# Patient Record
Sex: Male | Born: 1990 | Race: White | Hispanic: No | Marital: Single | Smoking: Never smoker
Health system: Southern US, Community
[De-identification: ages and names within clinical notes are randomized; demographics above are authoritative.]

## PROBLEM LIST (undated history)

## (undated) DIAGNOSIS — E669 Obesity, unspecified: Secondary | ICD-10-CM

## (undated) DIAGNOSIS — F909 Attention-deficit hyperactivity disorder, unspecified type: Secondary | ICD-10-CM

## (undated) DIAGNOSIS — F419 Anxiety disorder, unspecified: Secondary | ICD-10-CM

## (undated) DIAGNOSIS — R278 Other lack of coordination: Secondary | ICD-10-CM

## (undated) DIAGNOSIS — H539 Unspecified visual disturbance: Secondary | ICD-10-CM

## (undated) DIAGNOSIS — R488 Other symbolic dysfunctions: Secondary | ICD-10-CM

## (undated) DIAGNOSIS — I7300 Raynaud's syndrome without gangrene: Secondary | ICD-10-CM

## (undated) DIAGNOSIS — F810 Specific reading disorder: Secondary | ICD-10-CM

## (undated) HISTORY — DX: Other symbolic dysfunctions: R48.8

## (undated) HISTORY — PX: WISDOM TOOTH EXTRACTION: SHX21

## (undated) HISTORY — DX: Raynaud's syndrome without gangrene: I73.00

## (undated) HISTORY — DX: Unspecified visual disturbance: H53.9

## (undated) HISTORY — DX: Anxiety disorder, unspecified: F41.9

## (undated) HISTORY — DX: Attention-deficit hyperactivity disorder, unspecified type: F90.9

## (undated) HISTORY — DX: Obesity, unspecified: E66.9

## (undated) HISTORY — DX: Other lack of coordination: R27.8

## (undated) HISTORY — DX: Specific reading disorder: F81.0

## (undated) MED FILL — DEXTROAMPHETAMINE-AMPHETAMINE 20 MG TABLET: 20 20 mg | ORAL | 30 days supply | Qty: 60 | Fill #0

## (undated) MED FILL — VALACYCLOVIR 1 GRAM TABLET: 1000 1000 MG | ORAL | 1 days supply | Qty: 2 | Fill #0

---
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
## 2010-02-07 ENCOUNTER — Ambulatory Visit: Payer: Self-pay | Admitting: Pediatrics

## 2010-06-14 ENCOUNTER — Ambulatory Visit: Payer: Self-pay | Admitting: Pediatrics

## 2010-07-30 ENCOUNTER — Ambulatory Visit: Payer: Self-pay | Admitting: Family Medicine

## 2010-11-04 ENCOUNTER — Ambulatory Visit
Admission: RE | Admit: 2010-11-04 | Discharge: 2010-11-04 | Payer: Self-pay | Source: Home / Self Care | Attending: Pediatrics | Admitting: Pediatrics

## 2010-11-14 ENCOUNTER — Ambulatory Visit: Payer: Self-pay | Admitting: Family Medicine

## 2010-11-24 ENCOUNTER — Encounter: Payer: Self-pay | Admitting: Orthopedic Surgery

## 2010-12-19 ENCOUNTER — Ambulatory Visit: Payer: Self-pay | Admitting: Family Medicine

## 2010-12-24 ENCOUNTER — Ambulatory Visit: Payer: Self-pay | Admitting: Family Medicine

## 2011-01-07 ENCOUNTER — Ambulatory Visit: Payer: Self-pay

## 2011-01-16 ENCOUNTER — Ambulatory Visit: Payer: Self-pay | Admitting: Family Medicine

## 2011-03-12 ENCOUNTER — Institutional Professional Consult (permissible substitution): Payer: Self-pay | Admitting: Pediatrics

## 2011-03-12 DIAGNOSIS — R279 Unspecified lack of coordination: Secondary | ICD-10-CM

## 2011-03-12 DIAGNOSIS — F909 Attention-deficit hyperactivity disorder, unspecified type: Secondary | ICD-10-CM

## 2011-06-30 ENCOUNTER — Institutional Professional Consult (permissible substitution): Payer: 59 | Admitting: Pediatrics

## 2011-06-30 DIAGNOSIS — R279 Unspecified lack of coordination: Secondary | ICD-10-CM

## 2011-06-30 DIAGNOSIS — F909 Attention-deficit hyperactivity disorder, unspecified type: Secondary | ICD-10-CM

## 2011-08-12 ENCOUNTER — Ambulatory Visit: Payer: Self-pay | Admitting: Family Medicine

## 2011-10-16 ENCOUNTER — Institutional Professional Consult (permissible substitution): Payer: 59 | Admitting: Pediatrics

## 2011-10-16 DIAGNOSIS — F909 Attention-deficit hyperactivity disorder, unspecified type: Secondary | ICD-10-CM

## 2011-10-16 DIAGNOSIS — R279 Unspecified lack of coordination: Secondary | ICD-10-CM

## 2012-01-16 ENCOUNTER — Institutional Professional Consult (permissible substitution): Payer: 59 | Admitting: Pediatrics

## 2012-01-16 DIAGNOSIS — R279 Unspecified lack of coordination: Secondary | ICD-10-CM

## 2012-01-16 DIAGNOSIS — F909 Attention-deficit hyperactivity disorder, unspecified type: Secondary | ICD-10-CM

## 2012-05-12 ENCOUNTER — Institutional Professional Consult (permissible substitution): Payer: 59 | Admitting: Pediatrics

## 2012-05-12 DIAGNOSIS — R6250 Unspecified lack of expected normal physiological development in childhood: Secondary | ICD-10-CM

## 2012-05-12 DIAGNOSIS — F909 Attention-deficit hyperactivity disorder, unspecified type: Secondary | ICD-10-CM

## 2012-09-23 ENCOUNTER — Ambulatory Visit: Payer: Self-pay | Admitting: Family Medicine

## 2012-10-14 ENCOUNTER — Ambulatory Visit: Payer: Self-pay | Admitting: Family Medicine

## 2012-10-29 ENCOUNTER — Institutional Professional Consult (permissible substitution): Payer: 59 | Admitting: Pediatrics

## 2012-10-29 DIAGNOSIS — F909 Attention-deficit hyperactivity disorder, unspecified type: Secondary | ICD-10-CM

## 2012-10-29 DIAGNOSIS — R279 Unspecified lack of coordination: Secondary | ICD-10-CM

## 2012-12-06 ENCOUNTER — Ambulatory Visit: Payer: Self-pay | Admitting: Family Medicine

## 2012-12-20 ENCOUNTER — Ambulatory Visit: Payer: Self-pay | Admitting: Family Medicine

## 2013-01-24 ENCOUNTER — Institutional Professional Consult (permissible substitution): Payer: 59 | Admitting: Pediatrics

## 2013-01-24 DIAGNOSIS — R279 Unspecified lack of coordination: Secondary | ICD-10-CM

## 2013-01-24 DIAGNOSIS — F909 Attention-deficit hyperactivity disorder, unspecified type: Secondary | ICD-10-CM

## 2013-01-31 ENCOUNTER — Ambulatory Visit: Payer: Self-pay | Admitting: Family Medicine

## 2013-02-21 ENCOUNTER — Institutional Professional Consult (permissible substitution): Payer: 59 | Admitting: Pediatrics

## 2013-02-21 DIAGNOSIS — F909 Attention-deficit hyperactivity disorder, unspecified type: Secondary | ICD-10-CM

## 2013-02-21 DIAGNOSIS — R6250 Unspecified lack of expected normal physiological development in childhood: Secondary | ICD-10-CM

## 2013-02-25 ENCOUNTER — Institutional Professional Consult (permissible substitution): Payer: 59 | Admitting: Family

## 2013-02-25 DIAGNOSIS — F909 Attention-deficit hyperactivity disorder, unspecified type: Secondary | ICD-10-CM

## 2013-05-04 ENCOUNTER — Ambulatory Visit: Payer: Self-pay | Admitting: Ophthalmology

## 2013-07-18 ENCOUNTER — Institutional Professional Consult (permissible substitution): Payer: 59 | Admitting: Family

## 2013-07-18 DIAGNOSIS — F909 Attention-deficit hyperactivity disorder, unspecified type: Secondary | ICD-10-CM

## 2013-10-20 ENCOUNTER — Institutional Professional Consult (permissible substitution): Payer: 59 | Admitting: Pediatrics

## 2013-10-20 DIAGNOSIS — F909 Attention-deficit hyperactivity disorder, unspecified type: Secondary | ICD-10-CM

## 2014-01-30 ENCOUNTER — Institutional Professional Consult (permissible substitution): Payer: 59 | Admitting: Pediatrics

## 2014-01-30 DIAGNOSIS — F909 Attention-deficit hyperactivity disorder, unspecified type: Secondary | ICD-10-CM

## 2014-02-23 ENCOUNTER — Ambulatory Visit: Payer: Self-pay | Admitting: Family Medicine

## 2014-05-22 ENCOUNTER — Institutional Professional Consult (permissible substitution): Payer: 59 | Admitting: Pediatrics

## 2014-05-22 DIAGNOSIS — F909 Attention-deficit hyperactivity disorder, unspecified type: Secondary | ICD-10-CM

## 2014-08-22 ENCOUNTER — Institutional Professional Consult (permissible substitution): Payer: 59 | Admitting: Pediatrics

## 2014-09-05 ENCOUNTER — Institutional Professional Consult (permissible substitution): Payer: 59 | Admitting: Pediatrics

## 2014-09-18 ENCOUNTER — Institutional Professional Consult (permissible substitution): Payer: 59 | Admitting: Pediatrics

## 2014-09-18 DIAGNOSIS — F900 Attention-deficit hyperactivity disorder, predominantly inattentive type: Secondary | ICD-10-CM

## 2014-09-27 ENCOUNTER — Ambulatory Visit: Payer: 59 | Admitting: Psychologist

## 2014-09-27 DIAGNOSIS — F900 Attention-deficit hyperactivity disorder, predominantly inattentive type: Secondary | ICD-10-CM

## 2014-10-10 ENCOUNTER — Other Ambulatory Visit: Payer: 59 | Admitting: Psychologist

## 2014-10-11 ENCOUNTER — Other Ambulatory Visit: Payer: Private Health Insurance - Indemnity | Admitting: Psychologist

## 2014-10-11 DIAGNOSIS — F902 Attention-deficit hyperactivity disorder, combined type: Secondary | ICD-10-CM

## 2014-10-17 ENCOUNTER — Other Ambulatory Visit: Payer: Private Health Insurance - Indemnity | Admitting: Psychologist

## 2014-10-17 DIAGNOSIS — F900 Attention-deficit hyperactivity disorder, predominantly inattentive type: Secondary | ICD-10-CM

## 2014-10-17 DIAGNOSIS — F810 Specific reading disorder: Secondary | ICD-10-CM

## 2014-12-06 ENCOUNTER — Institutional Professional Consult (permissible substitution): Payer: Private Health Insurance - Indemnity | Admitting: Pediatrics

## 2014-12-06 DIAGNOSIS — F900 Attention-deficit hyperactivity disorder, predominantly inattentive type: Secondary | ICD-10-CM

## 2015-04-23 ENCOUNTER — Institutional Professional Consult (permissible substitution): Payer: 59 | Admitting: Pediatrics

## 2015-04-23 ENCOUNTER — Institutional Professional Consult (permissible substitution): Payer: Managed Care, Other (non HMO) | Admitting: Pediatrics

## 2015-04-23 DIAGNOSIS — F810 Specific reading disorder: Secondary | ICD-10-CM | POA: Diagnosis not present

## 2015-04-23 DIAGNOSIS — F900 Attention-deficit hyperactivity disorder, predominantly inattentive type: Secondary | ICD-10-CM | POA: Diagnosis not present

## 2015-05-02 ENCOUNTER — Ambulatory Visit: Payer: Managed Care, Other (non HMO) | Admitting: Psychologist

## 2015-05-02 DIAGNOSIS — F900 Attention-deficit hyperactivity disorder, predominantly inattentive type: Secondary | ICD-10-CM | POA: Diagnosis not present

## 2015-05-14 ENCOUNTER — Ambulatory Visit: Payer: Managed Care, Other (non HMO) | Admitting: Psychologist

## 2015-05-14 DIAGNOSIS — F900 Attention-deficit hyperactivity disorder, predominantly inattentive type: Secondary | ICD-10-CM | POA: Diagnosis not present

## 2015-05-17 ENCOUNTER — Other Ambulatory Visit: Payer: 59 | Admitting: Psychologist

## 2015-06-21 ENCOUNTER — Institutional Professional Consult (permissible substitution): Payer: 59 | Admitting: Psychologist

## 2015-07-19 ENCOUNTER — Institutional Professional Consult (permissible substitution): Payer: Managed Care, Other (non HMO) | Admitting: Pediatrics

## 2015-07-19 DIAGNOSIS — F810 Specific reading disorder: Secondary | ICD-10-CM | POA: Diagnosis not present

## 2015-07-19 DIAGNOSIS — F900 Attention-deficit hyperactivity disorder, predominantly inattentive type: Secondary | ICD-10-CM | POA: Diagnosis not present

## 2015-10-03 ENCOUNTER — Institutional Professional Consult (permissible substitution): Payer: Managed Care, Other (non HMO) | Admitting: Pediatrics

## 2015-10-03 DIAGNOSIS — F900 Attention-deficit hyperactivity disorder, predominantly inattentive type: Secondary | ICD-10-CM | POA: Diagnosis not present

## 2015-10-03 DIAGNOSIS — F810 Specific reading disorder: Secondary | ICD-10-CM | POA: Diagnosis not present

## 2015-12-19 ENCOUNTER — Other Ambulatory Visit (HOSPITAL_COMMUNITY): Payer: Self-pay | Admitting: Ophthalmology

## 2015-12-19 DIAGNOSIS — H5021 Vertical strabismus, right eye: Secondary | ICD-10-CM

## 2016-01-03 ENCOUNTER — Other Ambulatory Visit (HOSPITAL_COMMUNITY): Payer: Self-pay | Admitting: Ophthalmology

## 2016-01-03 ENCOUNTER — Ambulatory Visit (HOSPITAL_COMMUNITY)
Admission: RE | Admit: 2016-01-03 | Discharge: 2016-01-03 | Disposition: A | Discharge status: Home / Self Care | Payer: Managed Care, Other (non HMO) | Source: Ambulatory Visit | Attending: Ophthalmology | Admitting: Ophthalmology

## 2016-01-03 DIAGNOSIS — H5021 Vertical strabismus, right eye: Secondary | ICD-10-CM | POA: Diagnosis not present

## 2016-01-03 MED ORDER — GADOBENATE DIMEGLUMINE 529 MG/ML IV SOLN
15.0000 mL | Freq: Once | INTRAVENOUS | Status: AC | PRN
  Administered 2016-01-03: 15 mL via INTRAVENOUS

## 2016-01-21 ENCOUNTER — Telehealth: Payer: Self-pay | Admitting: Pediatrics

## 2016-01-21 MED ORDER — ATOMOXETINE HCL 100 MG PO CAPS: 100.0000 mg | ORAL_CAPSULE | Freq: Every day | ORAL | Status: DC

## 2016-01-21 NOTE — Telephone Encounter (Signed)
Call from [place], has appointment next Monday, 3/27, not enough meds to last Reordered strattera 100 mg daily with [facility] [address]

## 2016-01-28 ENCOUNTER — Ambulatory Visit (INDEPENDENT_AMBULATORY_CARE_PROVIDER_SITE_OTHER): Payer: Managed Care, Other (non HMO) | Admitting: Family

## 2016-01-28 ENCOUNTER — Encounter: Payer: Self-pay | Admitting: Family

## 2016-01-28 ENCOUNTER — Telehealth: Payer: Self-pay | Admitting: Family

## 2016-01-28 VITALS — BP 118/72 | HR 68 | Resp 16 | Ht 70.5 in | Wt 174.0 lb

## 2016-01-28 DIAGNOSIS — F419 Anxiety disorder, unspecified: Secondary | ICD-10-CM | POA: Diagnosis not present

## 2016-01-28 DIAGNOSIS — F902 Attention-deficit hyperactivity disorder, combined type: Secondary | ICD-10-CM | POA: Diagnosis not present

## 2016-01-28 MED ORDER — STRATTERA 80 MG PO CAPS: 80.0000 mg | ORAL_CAPSULE | Freq: Every day | ORAL | Status: DC

## 2016-01-28 NOTE — Progress Notes (Signed)
  [facility]  [facility]MENTAL AND PSYCHOLOGICAL CENTER [facility] [address], [address] [place] [address] Dept: [phone] Dept Fax: [phone] Loc: [phone] Loc Fax: [phone]  Medical Follow-up  Patient ID: Jonathan Morse, male  DOB: July 07, 1991, 25 y.o.  MRN: [id number]  Date of Evaluation: 01/28/16  PCP: Noni SaupeEDDING II,JOHN F., MD  Accompanied by: Self Patient Lives with: house with 2 roommates  HISTORY/CURRENT STATUS:  HPI  Patient here for routine follow up related to ADHD and medication management.  EDUCATION: School: Geophysicist/field se[employer] Year/Grade: Applying to [employer] Homework Time: varies depending on classes. 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 Current Exercise Habits: Home exercise routine, Type of exercise: strength training/weights, Time (Minutes): 60, Frequency (Times/Week): 5, Weekly Exercise (Minutes/Week): 300, Intensity: Moderate Exercise limited by: None identified  MEDICAL HISTORY: Appetite: Good MVI/Other: daily, pre-wrokout Fruits/Vegs:some Calcium: some Iron:some  Sleep: Bedtime: 11-12;00 am Awakens: 7:00 am on work days Sleep Concerns: Initiation/Maintenance/Other: Better, 7-9 hours each night  Individual Medical History/Review of System Changes? Yes Recent eye dr visit regarding double vision with [facility].  Allergies: Review of patient's allergies indicates no known allergies.  Current Medications:  Current outpatient prescriptions:  .  STRATTERA 80 MG capsule, Take 1 capsule (80 mg total) by mouth daily., Disp: 30 capsule, Rfl: 2 Medication Side Effects: None. Patient wanting to decrease his dose, no reported side effects.     Family Medical/Social History Changes?: No  MENTAL HEALTH: Mental Health Issues: Anxiety  PHYSICAL EXAM: Vitals: There were no vitals filed for this visit., Facility age limit for growth percentiles is 20 years.  General Exam: Physical Exam  Constitutional: He is oriented to person, place, and time. He appears well-developed and well-nourished.  HENT:  Head: Normocephalic and atraumatic.  Right Ear: External ear normal.  Left Ear: External ear normal.  Nose: Nose normal.  Mouth/Throat: Oropharynx is clear and moist.  Eyes: Conjunctivae and EOM are normal. Pupils are equal, round, and reactive to light.  Right eye stabismus  Neck: Normal range of motion. Neck supple.  Cardiovascular: Normal rate, regular rhythm, normal heart sounds and intact distal pulses.   Pulmonary/Chest: Effort normal and breath sounds normal.  Abdominal: Soft. Bowel sounds are normal.  Musculoskeletal: Normal range of motion.  Neurological: He is alert and oriented to person, place, and time. He has normal reflexes.  Skin: Skin is warm and dry.  Psychiatric: He has a normal mood and affect. His behavior is normal. Judgment and thought content normal.  Vitals reviewed.   Neurological: oriented to time, place, and person Cranial Nerves: normal  Neuromuscular:  Motor Mass: normal Tone: normal Strength: normal DTRs: 2+ and symmetric Overflow: none Reflexes: no tremors noted Sensory Exam: Vibratory: intact  Fine Touch: intact  Testing/Developmental Screens:  ASRS-9/16     DIAGNOSES:    ICD-9-CM ICD-10-CM   1. ADHD (attention deficit hyperactivity disorder), combined type 314.01 F90.2   2. Anxiety disorder, unspecified 300.00 F41.9     RECOMMENDATIONS: 3 month follow up. Continue with Strattera now at 80 mg daily dose, MiraLax for occasional constipation every few days.   NEXT APPOINTMENT: Return in about 3 months (around 04/29/2016) for Routine follow up.   [name],  NP Counseling Time: 30 Total Contact Time: 40

## 2016-01-28 NOTE — Telephone Encounter (Signed)
[facility] sent [facility] rejected notice for 80 mg Strattera. Needed a prior authorization for the dose change from 100 mg daily dose to 80 mg daily dose. Submitted prior authorization via cover my meds with confirmation received as follows: Approvedtoday  [id number];AOZHYQMCaseId:[id number];Product Name:ST: ADHD Agents (Adderall IR/XR, Aptensio Cruz CondonXR,Concerta,Daytrana,Desoxyn,Dexedrine,Dyanavel XR,Evekeo, Focalin IR/XR, Metadate CD/ER, Methylin, Procentra, Quillivant XR, QuilliChew ER, RitalinIR/LA/SR, Vyvanse, Kapvay, Intuniv, Strattera, etc) PA ESI;Status:Approved;Coverage Start Date:12/29/2015;Coverage End Date:01/27/2019;

## 2016-03-13 ENCOUNTER — Ambulatory Visit (INDEPENDENT_AMBULATORY_CARE_PROVIDER_SITE_OTHER): Payer: Managed Care, Other (non HMO) | Admitting: Pediatrics

## 2016-03-13 ENCOUNTER — Encounter: Payer: Self-pay | Admitting: Pediatrics

## 2016-03-13 VITALS — BP 110/60 | HR 84 | Ht 70.5 in | Wt 175.6 lb

## 2016-03-13 DIAGNOSIS — F902 Attention-deficit hyperactivity disorder, combined type: Secondary | ICD-10-CM

## 2016-03-13 DIAGNOSIS — F419 Anxiety disorder, unspecified: Secondary | ICD-10-CM

## 2016-03-13 DIAGNOSIS — I7300 Raynaud's syndrome without gangrene: Secondary | ICD-10-CM

## 2016-03-13 DIAGNOSIS — R488 Other symbolic dysfunctions: Secondary | ICD-10-CM | POA: Insufficient documentation

## 2016-03-13 DIAGNOSIS — R278 Other lack of coordination: Secondary | ICD-10-CM

## 2016-03-13 DIAGNOSIS — G509 Disorder of trigeminal nerve, unspecified: Secondary | ICD-10-CM

## 2016-03-13 DIAGNOSIS — F810 Specific reading disorder: Secondary | ICD-10-CM | POA: Diagnosis not present

## 2016-03-13 DIAGNOSIS — H4911 Fourth [trochlear] nerve palsy, right eye: Secondary | ICD-10-CM | POA: Insufficient documentation

## 2016-03-13 MED ORDER — STRATTERA 80 MG PO CAPS: 80.0000 mg | ORAL_CAPSULE | Freq: Every day | ORAL | Status: DC

## 2016-03-13 MED ORDER — MODAFINIL 200 MG PO TABS: 200.0000 mg | ORAL_TABLET | Freq: Every day | ORAL | Status: DC

## 2016-03-13 NOTE — Progress Notes (Signed)
[facility] [facility] [place] [facility] [address], [address] [place] [address] Dept: [phone] Dept Fax: [phone] Loc: [phone] Loc Fax: [phone]  Medical Follow-up  Patient ID: Jonathan FriendlyAlec Scott Harkin, male  DOB: 09-14-1991, 25 y.o.  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  MEDICAL HISTORY: Appetite: Good MVI/Other: Multivitamin and sometimes takes a pre-workout supplement before lifting weights. Fruits/Vegs: A lot Calcium: Yogurt Iron: A lot of meat and eggs  Sleep: Bedtime: 11:30 PM to 12:30 AM Awakens: 7- 8 AM Sleep Concerns: Initiation/Maintenance/Other: No problems  Individual Medical History/Review of System Changes? No  Allergies: Review of patient's allergies indicates no known allergies.  Current Medications:  Current outpatient prescriptions:  .  STRATTERA 80 MG capsule, Take 1 capsule (80 mg total) by mouth daily., Disp: 30 capsule, Rfl: 2 .  modafinil (PROVIGIL) 200 MG tablet, Take 1 tablet (200 mg total) by mouth daily. Take 1/2-1 tablet daily as directed., Disp: 30 tablet, Rfl: 2 Medication Side Effects: None  Family Medical/Social History Changes?: Yes patient is accepted into dental school at [employer] and will start in mid August 2017. He is going to [place] in June to spend some time with his girlfriend, who lives  there.  MENTAL HEALTH: Mental Health Issues: Friends, Peer Relations and Has a girlfriend in [place], and he is going there in June to visit her for at least one month. [place] then [place] and start dental school in the middle of August.  PHYSICAL EXAM: Vitals:  Today's Vitals   04/23/15 1515 07/19/15 1514 10/03/15 1513 03/13/16 1516  BP: 102/60 100/56 110/60   Pulse:   84   Height: 5' 10.5" (1.791 m) 5' 10.5" (1.791 m) 5' 10.5" (1.791 m) 5' 10.5" (1.791 m)  Weight: 176 lb (79.833 kg) 172 lb 12.8 oz (78.382 kg) 172 lb 9.6 oz (78.291 kg) 175 lb 9.6 oz (79.652 kg)  , Facility age limit for growth percentiles is 20 years.  General Exam: Physical Exam  Constitutional: He is oriented to person, place, and time. He appears well-developed and well-nourished.  HENT:  Head: Normocephalic and atraumatic.  Right Ear: External ear normal.  Left Ear: External ear normal.  Nose: Nose normal.  Mouth/Throat: Oropharynx is clear and moist.  Eyes: Conjunctivae and EOM are normal. Pupils are equal, round, and reactive to light.  Neck: Normal range of motion. Neck supple.  Cardiovascular: Normal rate, regular rhythm and normal heart sounds.   Pulmonary/Chest: Effort normal and breath sounds normal.  Abdominal: Soft.  Musculoskeletal: Normal range of motion.  Neurological: He is alert and oriented to person, place, and time. He has normal reflexes.  Skin: Skin is warm and dry. There is erythema.  Hands are red, consistent with Raynaud's phenomenon  Psychiatric: He has a normal mood and affect. His behavior is normal. Judgment and thought content normal.   Neurological: oriented to time, place, and person Cranial Nerves: normal Neuromuscular:  Motor Mass: normal Tone: normal Strength: normal DTRs: 2+ and  symmetric Overflow: no Reflexes: no tremors noted, finger to nose without dysmetria bilaterally, gait was normal, tandem gait was normal, can toe walk, can heel walk, can  hop on each foot and no ataxic movements noted Sensory Exam:  Fine Touch: normal  Testing/Developmental Screens: CGI:1    DIAGNOSES:    ICD-9-CM ICD-10-CM   1. Developmental dysgraphia 784.69 R48.8   2. ADHD (attention deficit hyperactivity disorder), combined type 314.01 F90.2   3. Anxiety disorder, unspecified 300.00 F41.9   4. Reading disorder 315.00 F81.0   5. Raynaud's phenomenon 443.0 I73.00     RECOMMENDATIONS:  Patient Instructions  Continue Strattera 80 mg every morning. May consider reducing dose to 60 mg over the next several months. If you want to do this, let us know to change the dose with pharmacy.  Continue modafinil 200 mg tabs, one half to one tablet daily as needed once school starts.  Continue to get regular exercise. Recommended cardio at least 4-5 times a week with weightlifting interspersed between.      NEXT APPOINTMENT: Return in about 3 months (around 06/13/2016).  Greater than 50 percent of the time spent in counseling, discussing diagnosis and management of symptoms with patient and family.  Roda Shutters, MD

## 2016-03-13 NOTE — Patient Instructions (Signed)
Continue Strattera 80 mg every morning. May consider reducing dose to 60 mg over the next several months. If you want to do this, let us know to change the dose with pharmacy.  Continue modafinil 200 mg tabs, one half to one tablet daily as needed once school starts.  Continue to get regular exercise. Recommended cardio at least 4-5 times a week with weightlifting interspersed between.

## 2016-06-05 ENCOUNTER — Ambulatory Visit (INDEPENDENT_AMBULATORY_CARE_PROVIDER_SITE_OTHER): Payer: Managed Care, Other (non HMO) | Admitting: Pediatrics

## 2016-06-05 ENCOUNTER — Institutional Professional Consult (permissible substitution): Payer: Self-pay | Admitting: Pediatrics

## 2016-06-05 ENCOUNTER — Encounter: Payer: Self-pay | Admitting: Pediatrics

## 2016-06-05 VITALS — BP 120/60 | Ht 71.0 in | Wt 173.6 lb

## 2016-06-05 DIAGNOSIS — I7300 Raynaud's syndrome without gangrene: Secondary | ICD-10-CM

## 2016-06-05 DIAGNOSIS — R278 Other lack of coordination: Secondary | ICD-10-CM

## 2016-06-05 DIAGNOSIS — R488 Other symbolic dysfunctions: Secondary | ICD-10-CM

## 2016-06-05 DIAGNOSIS — F419 Anxiety disorder, unspecified: Secondary | ICD-10-CM

## 2016-06-05 DIAGNOSIS — F810 Specific reading disorder: Secondary | ICD-10-CM | POA: Diagnosis not present

## 2016-06-05 DIAGNOSIS — F902 Attention-deficit hyperactivity disorder, combined type: Secondary | ICD-10-CM | POA: Diagnosis not present

## 2016-06-05 MED ORDER — MODAFINIL 200 MG PO TABS: ORAL_TABLET | ORAL | 2 refills | Status: DC

## 2016-06-05 MED ORDER — ATOMOXETINE HCL 60 MG PO CAPS: 60.0000 mg | ORAL_CAPSULE | Freq: Every day | ORAL | 2 refills | Status: DC

## 2016-06-05 MED ORDER — STRATTERA 60 MG PO CAPS: 60.0000 mg | ORAL_CAPSULE | Freq: Every day | ORAL | 2 refills | Status: DC

## 2016-06-05 NOTE — Patient Instructions (Signed)
Decrease dose of Strattera to 60 mg daily. We will continue with brand for now since we are changing the dose. If you do well on the 60 mg, it would be reasonable to try generic atomoxetine in the future if the cost is significantly less than the brand.  Continue modafinil 200 mg one half to one tablet every morning when necessary.  Continue to do regular exercise. I would recommend doing cardio for at least 30 minutes 5 times a week.  You should return in 3 months for follow-up if you want to continue getting your medications prescribed from this Center. If you want to find a doctor in [place] who will prescribe your medicines, that's fine. We just ask that you get your prescriptions written in one place with the other not go back and forth. You might want to check with the [facility] to see what services they have available.  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 If this needs to be done in the future, I'm sure Dr. Bobby Rumpf would be able to do this but let us know as soon as you know what is needed.

## 2016-06-05 NOTE — Addendum Note (Signed)
Addended by: Roda Shutters on: 06/05/2016 05:18 PM   Modules accepted: Orders

## 2016-06-05 NOTE — Progress Notes (Addendum)
[facility] [place] [address] Dept: [phone] Dept Fax: [phone] Loc: [phone] Loc Fax: [phone]  Medical Follow-up  Patient ID: Jonathan Morse, male  DOB: November 26, 1990, 25 y.o.  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 He will be moving into an apartment in [place] next week with a roommate who he does not yet know yet.  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 Runs weekly for about 2 miles. Runs about 7 or 8 minute mile.  MEDICAL HISTORY: Appetite: Good MVI/Other: Multivitamin and just starting to take a probiotic that was recommended by a holistic practitioner in [place]. She thought that there were some problems with GI function based on a Vega test. She also said that he has sensitivity to wheat and dairy, and he has found that he has less and swelling and redness when he avoids wheat and dairy products. Fruits/Vegs: A lot Calcium: Drinks almond milk and coconut milk now that he is on a nondairy diet. Iron: A lot of meat and eggs  Sleep: Bedtime: 11:30 PM to 12:30 AM Awakens: 7- 8 AM Sleep Concerns: Initiation/Maintenance/Other: No problems  Individual Medical History/Review of System Changes? No  Allergies: Review of patient's allergies indicates no known allergies.  Current Medications:  Current Outpatient Prescriptions:  .   modafinil (PROVIGIL) 200 MG tablet, Take 1/2-1 tablet daily as directed., Disp: 30 tablet, Rfl: 2 .  STRATTERA 60 MG capsule, Take 1 capsule (60 mg total) by mouth daily., Disp: 30 capsule, Rfl: 2 Medication Side Effects: None  Family Medical/Social History Changes?: Spent 2 months in [place] this summer and just returned about 1 week ago. Girlfriend lives in [place] and will come visit at Christmas time. He will move into an apartment in [place] next week and live with a roommate who he has not yet. The roommate will also be a Product manager.  MENTAL HEALTH: Mental Health Issues: None   PHYSICAL EXAM: Vitals:  Today's Vitals   06/05/16 1113  BP: 120/60  Weight: 173 lb 9.6 oz (78.7 kg)  Height: _0  (1.803 m)  , Facility age limit for growth percentiles is 20 years.  General Exam: Physical Exam  Constitutional: He is oriented to person, place, and time. He appears well-developed and well-nourished.  HENT:  Head: Normocephalic and atraumatic.  Right Ear: External ear normal.  Left Ear: External ear normal.  Nose: Nose normal.  Mouth/Throat: Oropharynx is clear and moist.  Eyes: Conjunctivae and EOM are normal. Pupils are equal, round, and reactive to light.  Neck: Normal range of motion. Neck supple.  Cardiovascular: Normal rate, regular rhythm and normal heart sounds.   Pulmonary/Chest: Effort normal and breath sounds normal.  Abdominal: Soft.  Musculoskeletal: Normal range of motion.  Neurological: He is alert and oriented to person, place, and time. He has normal reflexes.  Skin: Skin is warm and dry. No erythema.  No significant swelling and only mild erythema of both hands.  Psychiatric: He has a normal mood  and affect. His behavior is normal. Judgment and thought content normal.   Neurological: oriented to time, place, and person Cranial Nerves: normal Neuromuscular:  Motor Mass: normal Tone: normal Strength: normal DTRs: 2+ and  symmetric Overflow: no Reflexes: no tremors noted, finger to nose without dysmetria bilaterally, gait was normal, tandem gait was normal, can toe walk, can heel walk, can hop on each foot and no ataxic movements noted Sensory Exam:  Fine Touch: normal   Testing/Developmental Screens: ASRS:      DIAGNOSES:    ICD-9-CM ICD-10-CM   1. ADHD (attention deficit hyperactivity disorder), combined type 314.01 F90.2 STRATTERA 60 MG capsule     modafinil (PROVIGIL) 200 MG tablet  2. Developmental dysgraphia 784.69 R48.8   3. Reading disorder 315.00 F81.0   4. Anxiety disorder, unspecified 300.00 F41.9   5. Raynaud's phenomenon 443.0 I73.00     RECOMMENDATIONS:  Patient requests that we lower the dose of Strattera to 60 mg because we previously lowered it from 100 mg to 80 mg and he has not noticed any significant differences with this. We discussed brand versus generic and decided that since we are changing the dose, we should continue with brand at least for now. If he continues to do well on 60 mg of Strattera, it would be reasonable to use generic in the future if this would be advantageous for him.  Patient will continue to use modafinil when necessary. He has not needed this over the summer when he has not been in school, but he did report that it was very helpful regarding his focus when he was in school last year.  Since patient is moving to [place] to start dental school, he is uncertain if he will continue to be seen at [facility] or if he might want to find a primary care physician in [place] who would prescribe his medication. We discussed this, and I recommended that he asked what the physician would be willing to do before making an appointment. I told him that he could continue coming to this Center but that he needs to be seen every 3 months if we are 2 continue  writing prescriptions for him.  Patient was evaluated by a holistic practitioner in [place], where he spent the  past 2 months with his girlfriend. He was told that he had some GI issues and it was recommended that he take a probiotic, which he just started. He also was told that he is sensitive to dairy products and wheat, and he has modified his diet accordingly and has noted some improvement, particularly regarding his Raynaud's phenomenon. We discussed this and the implications of this testing and lifestyle modifications.  Patient called and reported that brand Strattera has a $250 co-pay while generic atomoxetine is $10. Therefore, I sent a revised prescription for atomoxetine 60 mg #30 with 2 refills to the pharmacy on file and told them to cancel the previous prescription for Strattera 60 mg d.a.w. medically necessary that was sent previously on 06/05/2016.  Patient Instructions  Decrease dose of Strattera to 60 mg daily. We will continue with brand for now since we are changing the dose. If you do well on the 60 mg, it would be reasonable to try generic atomoxetine in the future if the cost is significantly less than the brand.  Continue modafinil 200 mg one half to one tablet every morning when necessary.  Continue to do regular exercise. I would recommend doing cardio for at least 30 minutes  5 times a week.  You should return in 3 months for follow-up if you want to continue getting your medications prescribed from this Center. If you want to find a doctor in [place] who will prescribe your medicines, that's fine. We just ask that you get your prescriptions written in one place with the other not go back and forth. You might want to check with the [facility] to see what services they have available.  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 If this needs to be done in the future, I'm sure Dr. Bobby Rumpf would be able to do this but let us know as soon as you know what is  needed.   NEXT APPOINTMENT: Return in about 3 months (around 09/05/2016).  Greater than 50 percent of the time spent in counseling, discussing diagnosis and management of symptoms with patient and family.  Ottis Stain, MD   Counseling Time: 30 minutes       Total Time: 45 minutes

## 2016-08-11 ENCOUNTER — Ambulatory Visit (INDEPENDENT_AMBULATORY_CARE_PROVIDER_SITE_OTHER): Payer: Managed Care, Other (non HMO) | Admitting: Pediatrics

## 2016-08-11 ENCOUNTER — Encounter: Payer: Self-pay | Admitting: Pediatrics

## 2016-08-11 VITALS — Wt 176.8 lb

## 2016-08-11 DIAGNOSIS — F810 Specific reading disorder: Secondary | ICD-10-CM

## 2016-08-11 DIAGNOSIS — R278 Other lack of coordination: Secondary | ICD-10-CM

## 2016-08-11 DIAGNOSIS — R488 Other symbolic dysfunctions: Secondary | ICD-10-CM

## 2016-08-11 DIAGNOSIS — F902 Attention-deficit hyperactivity disorder, combined type: Secondary | ICD-10-CM | POA: Diagnosis not present

## 2016-08-11 MED ORDER — ATOMOXETINE HCL 60 MG PO CAPS: 60.0000 mg | ORAL_CAPSULE | Freq: Every day | ORAL | 2 refills | Status: DC

## 2016-08-11 MED ORDER — MODAFINIL 200 MG PO TABS
ORAL_TABLET | ORAL | 2 refills | Status: DC
Start: 2016-08-11 — End: 2016-10-23

## 2016-08-11 NOTE — Progress Notes (Signed)
[facility] [address], [address] [place] [address] Dept: [phone] Dept Fax: [phone] Loc: [phone] Loc Fax: [phone]  Medical Follow-up  Patient ID: Jonathan FriendlyAlec Scott Rund, male  DOB: [birth year], 25 y.o.  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 [place].  HISTORY/CURRENT STATUS:  HPI routine three-month follow-up for medication management of ADHD.  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 Started in mid August 2017, and is on fall break at the present time. School is year round for 4 years with 4 breaks per year. Performance/Grades: They do not receive letter grades but are graded on a "grid". He is passing but is not doing as well as he would like to do. He spends 3-5 hours working out of class on an average day and 4-6 hours before tests. His difficulty with reading makes it challenging to keep up. Services: He gets time and a half for tests but no other services. Exercise: He is still lifting weights most days, but he is only running once every week or 2 for about 2 miles because of time limitations secondary to studying.  MEDICAL HISTORY: Appetite: Good MVI/Other: Multivitamin and continuing to take a probiotic that was recommended by a holistic practitioner in [place]. She thought that there were some problems with GI function based on a Vega test. She also said that he has sensitivity to wheat and dairy, and he has found that he has less and swelling and redness when he avoids wheat and dairy products. Taking psyllium husk most mornings for constipation and this is helping as  well. Fruits/Vegs: A lot Calcium: Drinks almond milk and coconut milk now that he is on a nondairy diet. Iron: A lot of meat and eggs  Sleep: Bedtime: 12 midnight -1 AM Awakens:  7 AM Sleep Concerns: Initiation/Maintenance/Other: Has a hard time "turning his brain off" but is falling asleep okay. Does awaken during the night several times a week, primarily on school nights, usually because of school-related worries. He does feel that he is getting enough sleep.  Individual Medical History/Review of System Changes? No  Allergies: Review of patient's allergies indicates no known allergies.  Current Medications:  Current Outpatient Prescriptions:  .  atomoxetine (STRATTERA) 60 MG capsule, Take 1 capsule (60 mg total) by mouth daily., Disp: 30 capsule, Rfl: 2 .  modafinil (PROVIGIL) 200 MG tablet, Take 1/2-1 tablet daily as directed., Disp: 30 tablet, Rfl: 2 Medication Side Effects: None  Family Medical/Social History Changes?: He recently broke up with his girlfriend in [place] because he doesn't have time to devote to the relationship. She is still coming to the [place] for [place], and he will probably see her then.  Mental Health Issues: He denies any significant problems in this area.  PHYSICAL EXAM: Vitals:  Today's Vitals   08/11/16 1516  Weight: 176 lb 12.8 oz (80.2 kg)  , Facility age limit for growth percentiles is 20 years.  General Exam: Physical Exam  Constitutional: He is oriented to person, place, and time. He appears well-developed and well-nourished.  HENT:  Head: Normocephalic and atraumatic.  Right Ear: External ear normal.  Left Ear: External ear normal.  Nose: Nose normal.  Mouth/Throat: Oropharynx  is clear and moist.  Eyes: Conjunctivae and EOM are normal. Pupils are equal, round, and reactive to light.  Neck: Normal range of motion. Neck supple.  Cardiovascular: Normal rate, regular rhythm and normal heart sounds.   Pulmonary/Chest: Effort normal and  breath sounds normal.  Abdominal: Soft.  Musculoskeletal: Normal range of motion.  Neurological: He is alert and oriented to person, place, and time. He has normal reflexes.  Skin: Skin is warm and dry. No erythema.  No significant swelling and only mild erythema of both hands.  Psychiatric: He has a normal mood and affect. His behavior is normal. Judgment and thought content normal.   Neurological: oriented to time, place, and person Cranial Nerves: normal Neuromuscular:  Motor Mass: normal Tone: normal Strength: normal DTRs: 2+ and symmetric Overflow: no Reflexes: no tremors noted, finger to nose without dysmetria bilaterally, gait was normal, tandem gait was normal, can toe walk, can heel walk, can hop on each foot and no ataxic movements noted Sensory Exam:  Fine Touch: normal   Testing/Developmental Screens: CGI: 2       DIAGNOSES:    ICD-9-CM ICD-10-CM   1. ADHD (attention deficit hyperactivity disorder), combined type 314.01 F90.2 atomoxetine (STRATTERA) 60 MG capsule     modafinil (PROVIGIL) 200 MG tablet  2. Reading disorder 315.00 F81.0   3. Developmental dysgraphia 784.69 R48.8     RECOMMENDATIONS:   Patient Instructions  Continue atomoxetine 60 mg every morning. If you are having difficulty paying attention in class and/or for out of class work and would like to increase the dose of atomoxetine back to 80 mg every morning, either call or have the pharmacy contact us. Since patient is now living in [facility] and attending dental school at [employer] the pharmacy was changed to a [facility] in [place] .  Continue modafinil 200 mg tablets, one half to one tablet every morning when necessary. Prescription for 30 tablets with no refills printed, signed and given to patient.   NEXT APPOINTMENT: Return in about 3 months (around 11/11/2016).  Greater than 50 percent of the time spent in counseling, discussing diagnosis and management of  symptoms with patient and family.  Roda Shutters, MD   Counseling Time: 30 minutes       Total Time: 45 minutes

## 2016-08-11 NOTE — Patient Instructions (Addendum)
Continue atomoxetine 60 mg every morning. If you are having difficulty paying attention in class and/or for out of class work and would like to increase the dose of atomoxetine back to 80 mg every morning, either call or have the pharmacy contact us. Since patient is now living in [place] and attending dental school at [employer], the pharmacy [facility] [place] .  Continue modafinil 200 mg tablets, one half to one tablet every morning when necessary. Prescription for 30 tablets with no refills printed, signed and given to patient.

## 2016-10-23 ENCOUNTER — Encounter: Payer: Self-pay | Admitting: Pediatrics

## 2016-10-23 ENCOUNTER — Ambulatory Visit (INDEPENDENT_AMBULATORY_CARE_PROVIDER_SITE_OTHER): Payer: Managed Care, Other (non HMO) | Admitting: Pediatrics

## 2016-10-23 VITALS — BP 118/64 | Wt 176.8 lb

## 2016-10-23 DIAGNOSIS — F810 Specific reading disorder: Secondary | ICD-10-CM

## 2016-10-23 DIAGNOSIS — R278 Other lack of coordination: Secondary | ICD-10-CM

## 2016-10-23 DIAGNOSIS — F902 Attention-deficit hyperactivity disorder, combined type: Secondary | ICD-10-CM

## 2016-10-23 DIAGNOSIS — K5900 Constipation, unspecified: Secondary | ICD-10-CM

## 2016-10-23 DIAGNOSIS — R488 Other symbolic dysfunctions: Secondary | ICD-10-CM

## 2016-10-23 MED ORDER — ATOMOXETINE HCL 60 MG PO CAPS: 60.0000 mg | ORAL_CAPSULE | Freq: Every day | ORAL | 2 refills | Status: AC

## 2016-10-23 MED ORDER — MODAFINIL 200 MG PO TABS: ORAL_TABLET | ORAL | 0 refills | Status: AC

## 2016-10-23 NOTE — Progress Notes (Signed)
[facility] [address], [address] [place] [address] Dept: [phone] Dept Fax: [phone] Loc: [phone] Loc Fax: [phone]  Medical Follow-up  Patient ID: Jonathan FriendlyAlec Scott Morse, male  DOB: 14-May-1991, 25 y.o.  MRN: [id number]  Date of Evaluation: 06/05/2016  PCP: Noni SaupeEDDING II,JOHN F., MD  Accompanied by: Self Patient Lives with: Living in an apartment in [place] a roommate, and this is working well.  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 They do not get letter grades. Services: Accommodations including extra time for tests and separate setting. Activities/Exercise: Lifts weights for 5 times a week using a routine that simulates a cardio workout. He also runs at least once or twice a week.  MEDICAL HISTORY: Appetite: Good MVI/Other: Multivitamin but stopped taking a probiotic that was recommended by a holistic practitioner in [place]. She thought that there were some problems with GI function based on a Vega test. She also said that he has sensitivity to wheat and dairy, and he has found that he has less and swelling and redness of his hands when he avoids wheat and dairy products. He is also taking omega-3 fatty acids. Fruits/Vegs: A lot Calcium: Drinks almond milk and coconut milk now that he is on a nondairy diet. Iron: A lot of meat and eggs  Sleep: Bedtime: 12 midnight Awakens: 7:15 a.m. Sleep Concerns: Initiation/Maintenance/Other: No problems  Individual Medical History/Review of System Changes? No  Allergies: Patient has no known allergies.  Current Medications:  Current  Outpatient Prescriptions:  .  atomoxetine (STRATTERA) 60 MG capsule, Take 1 capsule (60 mg total) by mouth daily., Disp: 30 capsule, Rfl: 2 .  modafinil (PROVIGIL) 200 MG tablet, Take 1/2-1 tablet daily as directed., Disp: 30 tablet, Rfl: 0 Medication Side Effects: None  Family Medical/Social History Changes?: [place] lives in [place] and seen in an apartment with one roommate, and they get along fairly well. The roommate is also a Chiropractorfirst-year dental student. [name] does not have a girlfriend at the present time although his former girlfriend from [place] is in the [place], and he will probably see her over the holiday break.  MENTAL HEALTH: Mental Health Issues: None   PHYSICAL EXAM: Vitals:  Today's Vitals   10/23/16 1015  BP: 118/64  Weight: 176 lb 12.8 oz (80.2 kg)  , Facility age limit for growth percentiles is 20 years.  General Exam: Physical Exam  Constitutional: He is oriented to person, place, and time. He appears well-developed and well-nourished.  HENT:  Head: Normocephalic and atraumatic.  Right Ear: External ear normal.  Left Ear: External ear normal.  Nose: Nose normal.  Mouth/Throat: Oropharynx is clear and moist.  Eyes: Conjunctivae and EOM are normal. Pupils are equal, round, and reactive to light.  Neck: Normal range of motion. Neck supple.  Cardiovascular: Normal rate, regular rhythm and normal heart sounds.   Pulmonary/Chest: Effort normal and breath sounds normal.  Abdominal: Soft.  Musculoskeletal: Normal range of motion.  Neurological: He is alert and oriented to person, place, and time. He has normal reflexes.  Skin: Skin is warm and dry. No erythema.  No significant swelling and only mild erythema of both hands.  Psychiatric: He has  a normal mood and affect. His behavior is normal. Judgment and thought content normal.   Neurological: oriented to time, place, and person Cranial Nerves: normal Neuromuscular:  Motor Mass: normal Tone:  normal Strength: normal DTRs: 2+ and symmetric Overflow: no Reflexes: no tremors noted, finger to nose without dysmetria bilaterally, gait was normal, tandem gait was normal, can toe walk, can heel walk, can hop on each foot and no ataxic movements noted Sensory Exam:  Fine Touch: normal   Testing/Developmental Screens: CGI: 2  DIAGNOSES:    ICD-9-CM ICD-10-CM   1. ADHD (attention deficit hyperactivity disorder), combined type 314.01 F90.2 atomoxetine (STRATTERA) 60 MG capsule     modafinil (PROVIGIL) 200 MG tablet  2. Developmental dysgraphia 784.69 R48.8   3. Reading disorder 315.00 F81.0   4. Constipation, unspecified constipation type 564.00 K59.00     RECOMMENDATIONS:  [name] reports that he is doing well on 60 mg of generic atomoxetine, but he does not want to reduce the dose any lower at this time. Therefore, we will continue 60 mg every morning.  [name] has also been using modafinil one half of a 200 mg tablet every morning as needed, and he thinks he probably will be taking it more frequently because of the intensity of dental school.  [name] reported that he has experienced constipation, and we discussed several over-the-counter medications that might be helpful for this.  We reviewed Terrall's weight over the past year, and it is essentially unchanged. I recommend that he continue to exercise on a regular basis and eat a healthy diet.  Patient Instructions  Continue atomoxetine 60 mg daily. Prescription for 30 capsules with 2 refills [facility] to [facility] [place]  Continue modafinil 200 mg tablets, 1/2-1 every morning. Prescription for 30 tablets with no refills printed and signed.  Constipation, try Metamucil, Fiberall, mineral oil, milk of magnesia, or whenever your local pharmacist recommends.   NEXT APPOINTMENT: Return in about 3 months (around 01/21/2017).  Greater than 50 percent of the time spent in counseling, discussing diagnosis and management of symptoms with  patient and family.  Roda Shuttershomas H. Latori Beggs, MD   Counseling Time: 30 minutes       Total Time: 45 minutes

## 2016-10-23 NOTE — Patient Instructions (Signed)
Continue atomoxetine 60 mg daily. Prescription for 30 capsules with 2 refills sent to [facility] [place]  Continue modafinil 200 mg tablets, 1/2-1 every morning. Prescription for 30 tablets with no refills printed and signed.  Constipation, try Metamucil, Fiberall, mineral oil, milk of magnesia, or whenever your local pharmacist recommends.

## 2017-02-09 ENCOUNTER — Telehealth: Payer: Self-pay | Admitting: Pediatrics

## 2017-02-09 NOTE — Telephone Encounter (Signed)
°  Faxed one year's worth of records and two years worth of med sheets to [facility]. tl

## 2017-04-08 ENCOUNTER — Other Ambulatory Visit: Payer: Self-pay | Admitting: Pediatrics

## 2017-04-08 NOTE — Telephone Encounter (Signed)
[facility] for Mod[facility]il.  Patient last seen 10/29/16.  Left message for patient to call and clarify whether he intends to continue treatment here.

## 2017-04-09 NOTE — Telephone Encounter (Signed)
Talked with Jonathan HopperAlec Morse (patient) He is going to school in [place] and does not intend to come back to [place] every 3 months for appointments He has a provider in [place] that has been writing his Rx for atomoxetine for him He will approach this PCP to have them write for the modafinil.

## 2017-04-15 ENCOUNTER — Telehealth: Payer: Self-pay | Admitting: Family

## 2017-04-15 NOTE — Telephone Encounter (Signed)
Faxed last office visit note from 06/05/16 to [facility] documenting diagnosis and medications for prior authorization for Provigil.  tl

## 2018-02-22 IMAGING — MR MR ORBITS WO/W CM
5 of 9 series · 19 of 48 positions shown · IV contrast (multihance)
Comparison: None.

CLINICAL DATA: Hypertropia of the right eye.

EXAM:
MRI OF THE ORBITS WITHOUT AND WITH CONTRAST
TECHNIQUE: Multiplanar, multisequence MR imaging of the orbits was performed
both before and after the administration of intravenous contrast.
CONTRAST:  15mL MULTIHANCE GADOBENATE DIMEGLUMINE 529 MG/ML IV SOLN

[Series 2: FLAIR · sagittal · 5.0mm · 0.47mm/px · 4 of 23 slices shown]
[im 1/23]
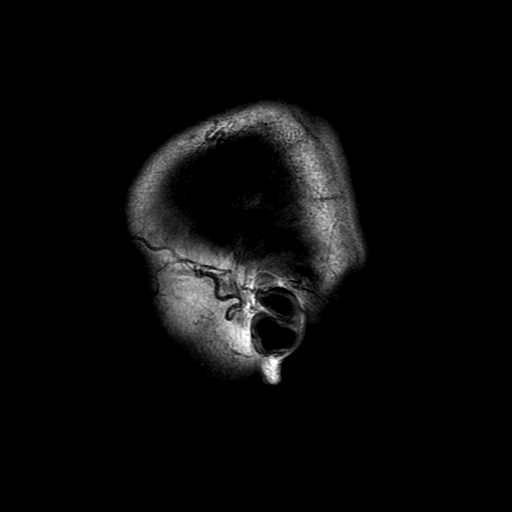
[im 8/23]
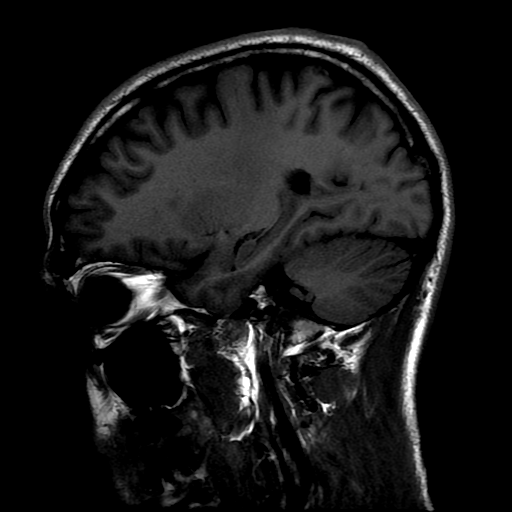
[im 15/23]
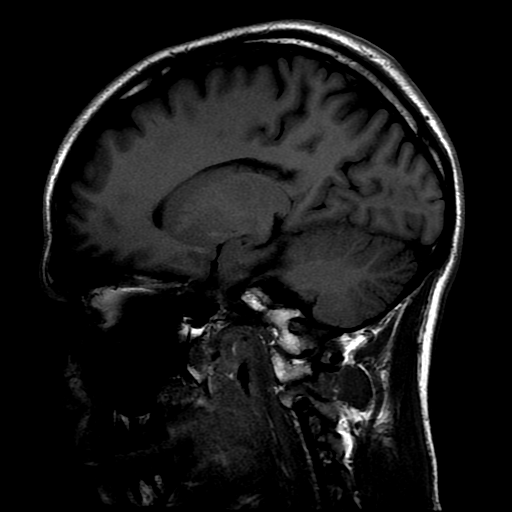
[im 23/23]
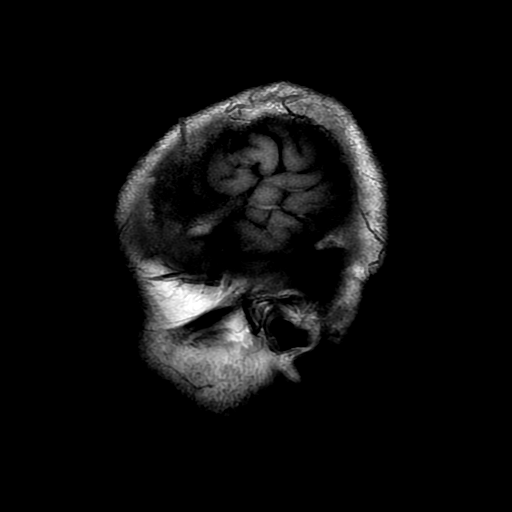

[Series 3: T2 fat-sat · axial · 3.0mm · 0.35mm/px · z∈[-34,+16]mm · 3 of 18 slices shown (1 of 3)]
[im 1/18]
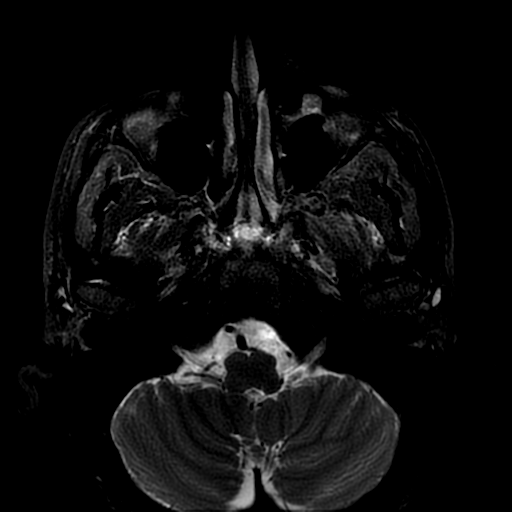
[im 9/18]
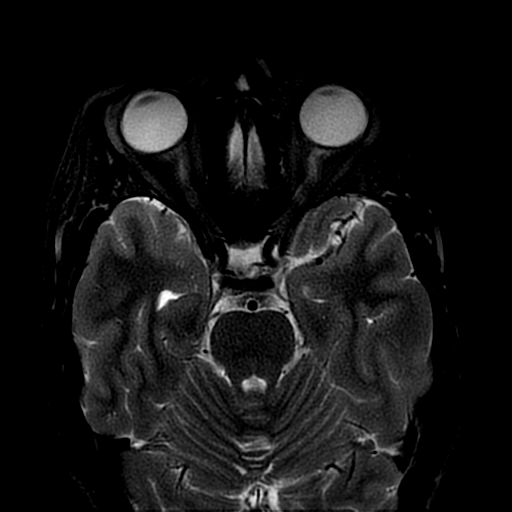
[im 18/18]
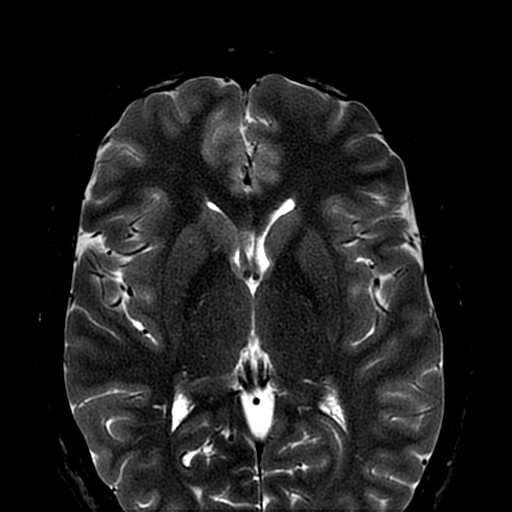

[Series 4: T2 fat-sat · coronal · 4.0mm · 0.35mm/px · 4 of 23 slices shown (2 of 3)]
[im 1/23]
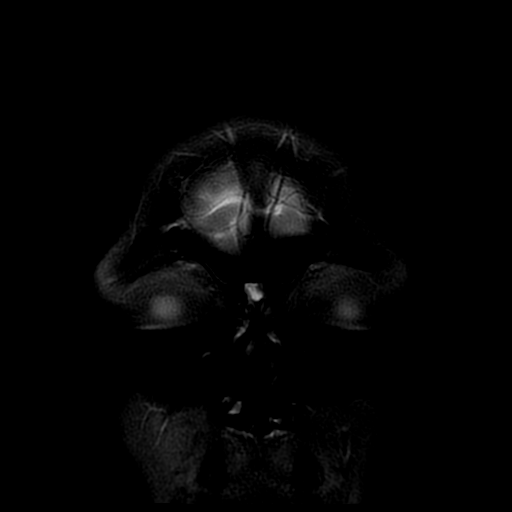
[im 8/23]
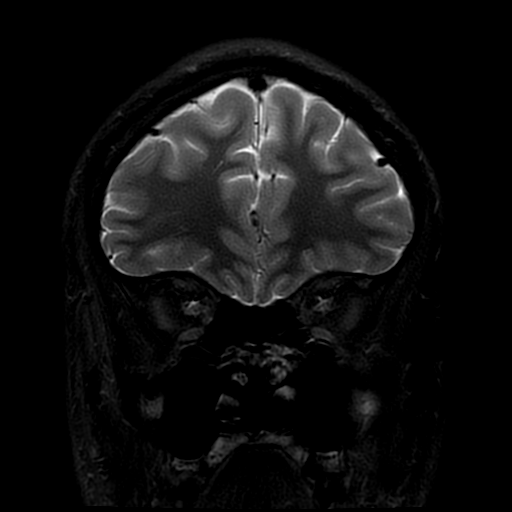
[im 15/23]
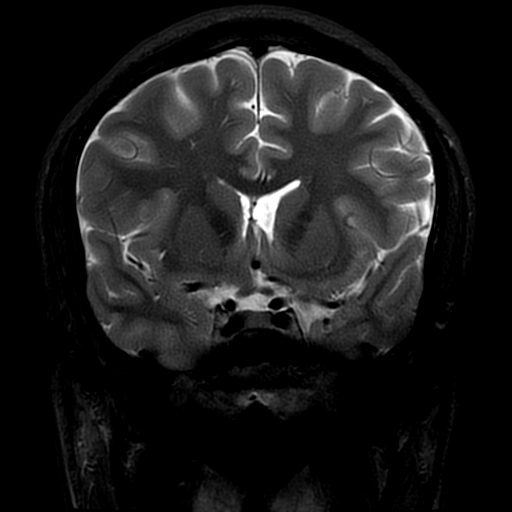
[im 23/23]
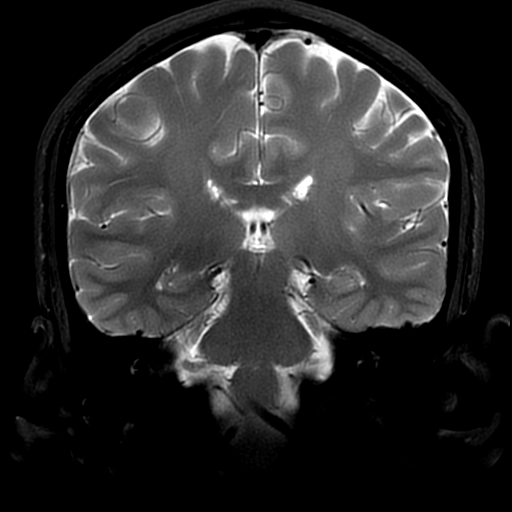

[Series 5: T1 · coronal · 4.0mm · 0.35mm/px · 4 of 23 slices shown]
[im 1/23]
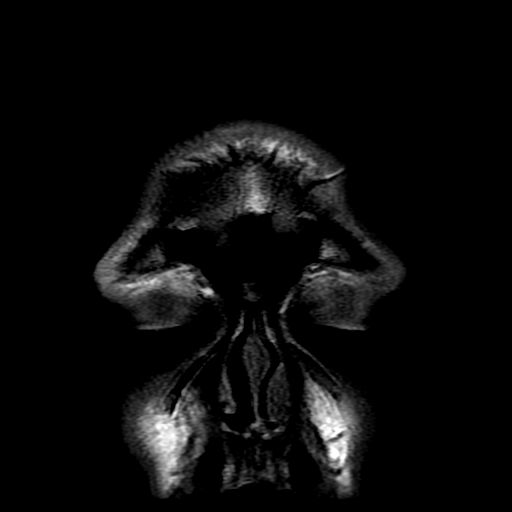
[im 6/23]
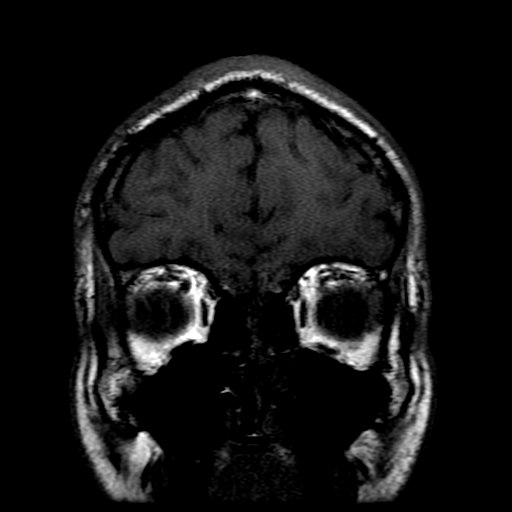
[im 12/23]
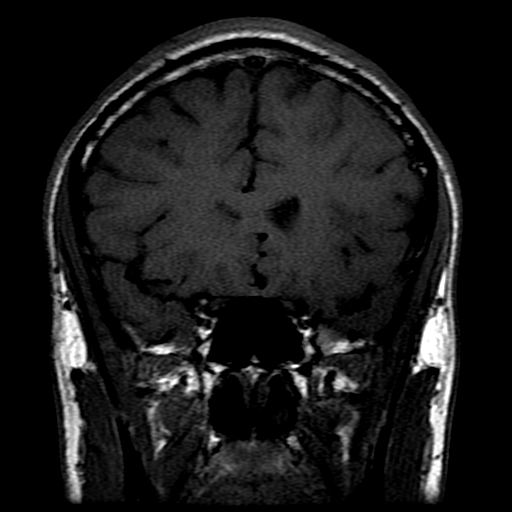
[im 17/23]
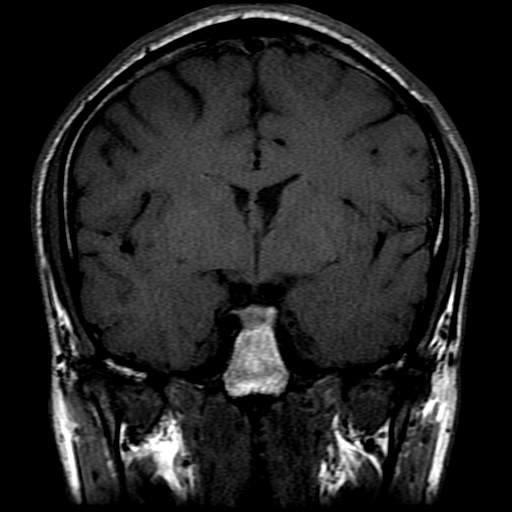

[Series 7: T2 fat-sat · axial · 3.0mm · 0.35mm/px · z∈[-34,+16]mm · 4 of 18 slices shown (3 of 3)]
[im 1/18]
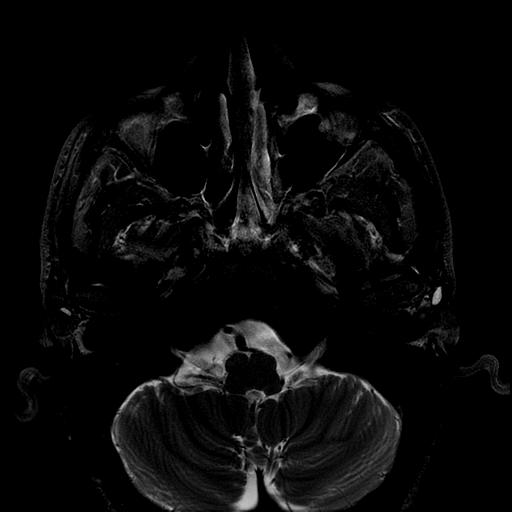
[im 6/18]
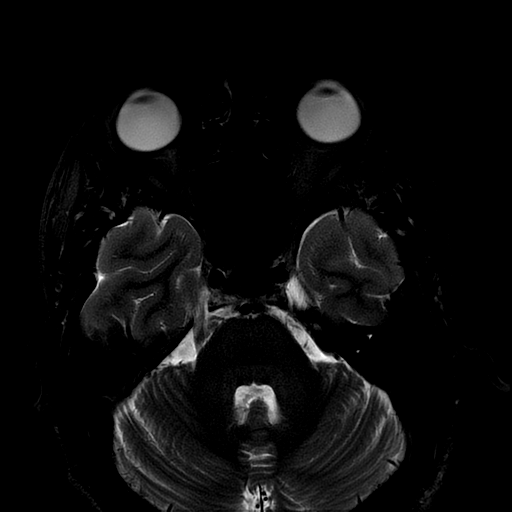
[im 12/18]
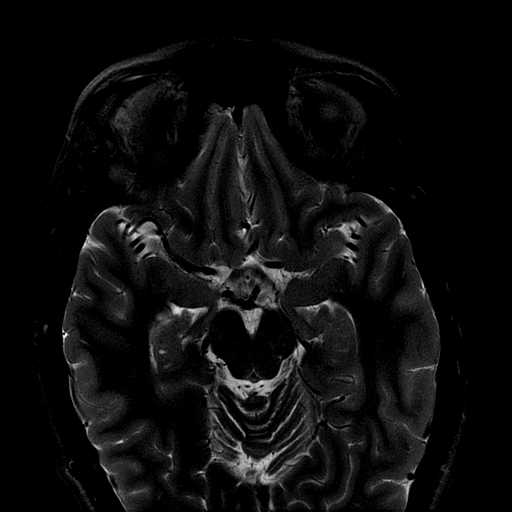
[im 18/18]
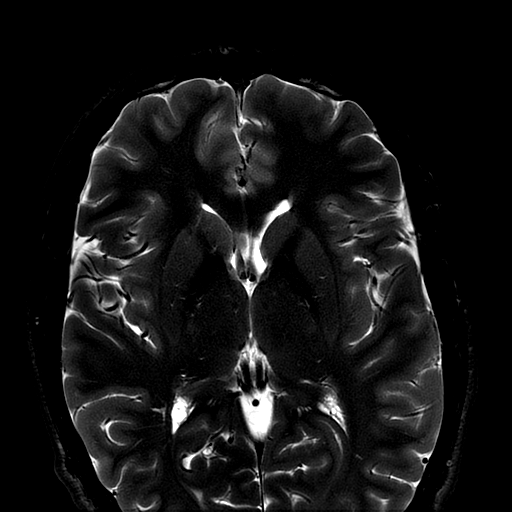

[19 of 48 positions shown; findings below may reference images not displayed]

FINDINGS: The globes are within normal limits bilaterally. The lenses are
located. The optic nerve is unremarkable.

Extraocular muscles are within normal limits bilaterally in
symmetric. The optic chiasm and optic tracts are within normal
limits.

The brainstem is unremarkable. No focal lesions are evident in the
region of the fourth cranial nerve nucleus.

The postcontrast images demonstrate no pathologic enhancement.

The retro-orbital fat is clean. The orbital apex is within normal
limits. The cavernous sinus is normal bilaterally. Flow is present
in the major intracranial arteries.

The visualized portions the brain are unremarkable. Postcontrast
images through the entirety the brain are within normal limits.
IMPRESSION: Negative MRI of the orbits. No acute or focal lesion to explain
hypertropia.

## 2020-04-26 ENCOUNTER — Ambulatory Visit: Admit: 2020-04-26 | Payer: PRIVATE HEALTH INSURANCE

## 2020-04-26 DIAGNOSIS — F909 Attention-deficit hyperactivity disorder, unspecified type: Secondary | ICD-10-CM

## 2020-04-26 DIAGNOSIS — Z113 Encounter for screening for infections with a predominantly sexual mode of transmission: Secondary | ICD-10-CM

## 2020-04-26 LAB — RENAL FUNCTION PANEL W/EGFR
Albumin: 4.9 g/dL (ref 3.5–5.7)
Anion Gap: 11 mmol/L (ref 3–16)
BUN: 18 mg/dL (ref 7–25)
CO2: 29 mmol/L (ref 21–33)
Calcium: 10.4 mg/dL (ref 8.6–10.3)
Chloride: 103 mmol/L (ref 98–110)
Creatinine: 1.12 mg/dL (ref 0.60–1.30)
Glucose: 54 mg/dL (ref 70–100)
Osmolality, Calculated: 295 mOsm/kg (ref 278–305)
Phosphorus: 3.6 mg/dL (ref 2.1–4.7)
Potassium: 4.5 mmol/L (ref 3.5–5.3)
Sodium: 143 mmol/L (ref 133–146)
eGFR AA CKD-EPI: >90 See note.
eGFR NONAA CKD-EPI: 88 See note.

## 2020-04-26 LAB — CHLAMYDIA / GONORRHOEAE DNA URINE
Chlamydia Trachomatis DNA Urine: NEGATIVE
Neisseria gonorrhoeae DNA Urine: NEGATIVE

## 2020-04-26 LAB — MAGNESIUM: Magnesium: 2.2 mg/dL (ref 1.5–2.5)

## 2020-04-26 LAB — HIV 1+2 ANTIBODY/ANTIGEN WITH REFLEX: HIV 1+2 AB/AGN: NONREACTIVE

## 2020-04-26 LAB — HEPATITIS C ANTIBODY: HCV Ab: NONREACTIVE

## 2020-04-26 LAB — VITAMIN D 25 HYDROXY: Vit D, 25-Hydroxy: 75.8 ng/mL (ref 30.0–100.0)

## 2020-04-26 LAB — TREPONEMA PALLIDUM AB WITH REFLEX: Treponema Pallidum: NEGATIVE

## 2020-04-26 MED ORDER — buPROPion XL (WELLBUTRIN XL) 150 MG 24 hr tablet
150 | ORAL_TABLET | Freq: Every day | ORAL | 3 refills | Status: AC
Start: 2020-04-26 — End: 2021-02-15

## 2020-04-26 MED ORDER — dextroamphetamine-amphetamine (ADDERALL) 10 mg Tab
10 | ORAL_TABLET | Freq: Two times a day (BID) | ORAL | 0 refills | Status: AC | PRN
Start: 2020-04-26 — End: 2020-10-22

## 2020-04-26 MED ORDER — dextroamphetamine-amphetamine (ADDERALL) 10 mg Tab
10 | ORAL_TABLET | Freq: Two times a day (BID) | ORAL | 0 refills | Status: AC | PRN
Start: 2020-04-26 — End: 2021-02-27

## 2020-04-26 MED ORDER — dextroamphetamine-amphetamine (ADDERALL) 10 mg Tab
10 | ORAL_TABLET | Freq: Two times a day (BID) | ORAL | 0 refills | Status: AC | PRN
Start: 2020-04-26 — End: 2020-09-04

## 2020-04-26 NOTE — Unmapped (Addendum)
Thank you for choosing [facility] for your primary care.   - Have your blood work drawn down the hall in [address]. Our office will contact you with the results.  - Return in 6 months for ADHD follow up      Calcium and Vitamin D in the Prevention of Bone Loss     Calcium      The daily requirement of calcium is 1200 mg daily     Estimating calcium in your diet:     Assume 300 mg in most diets at baseline, and then add per serving:     Low fat yogurt                         1 cup               400 mg  Skim milk                                 1 cup               300 mg  Cheese                                    1.5 oz              300 mg  Low fat cottage cheese           1 cup               200 mg  Calcium-fortified  OJ               1 cup               300 mg  Raw broccoli/kale                    1 cup                 50 mg  1 slice bread                                                      50 mg     If your diet contains less than the above, try adding these calcium rich foods.  If this is not feasible, you may need supplements over the counter.       Calcium supplements:     Calcium carbonate (Caltrate) is more reasonably priced but less well absorbed.  Best taken with a meal.       Calcium citrate (Citracal) is better absorbed and often better tolerated.        Daily intakes above 2000 mg with diet and supplements are not recommended!  Calcium supplements have few side effects but bloating and abdominal discomfort are the most frequent.         Vitamin D    Daily intake of vitamin D of [phone] units per day will meet the daily needs of most women.       From April to October in [place], you may be able to get some of this Vitamin through midday sunlight exposure but less so the  browner your skin and if you use sun-block the whole time you are outdoors.     Vitamin D is important for absorption of calcium and also for bone health.  Vitamin D deficiency results in bone loss and also causes muscle weakness and an  increased tendency to fall.       It is possible to get too much vitamin D but that is unlikely with over-the-counter doses. The ???safe upper limit??? is 4000 IU daily in patients who are not deficient.      Estimating vitamin D in your diet:     Fortified Milk (8 oz)                     100 IU  Fortified Orange juice (8 oz)       100 IU     Fortified yogurt (8 oz)                   100 IU  Salmon, wild (3.5 oz)                   [phone] IU  Salmon, farmed                           100-250 IU  Tuna, canned (3.5 oz)                 250 IU     Most multivitamins contain some vitamin D, usually 400 IU.   Over the counter supplements of vitamin D alone (usually 1000 or 2000 IU) or vitamin D with calcium are easily available.  Generic brands are equally recommended.

## 2020-04-26 NOTE — Unmapped (Signed)
[facility]  Department of Internal Medicine  New Patient Visit    Chief Complaint:     Chief Complaint   Patient presents with   ??? New Patient Visit/ Consultation     History of Present Ilness:     Alan Reynolds is a 29 y.o. male who  has a past medical history of ADHD and Anxiety.     ADHD  Has pretty good control overall right now. He eats well despite the stimulant.       Past Medical History:     Past Medical History:   Diagnosis Date   ??? ADHD    ??? Anxiety      Patient Active Problem List   Diagnosis   ??? ADHD   ??? Routine health maintenance       Medications:     Current Outpatient Medications   Medication Sig   ??? buPROPion XL Take 1 tablet (150 mg total) by mouth daily.   ??? cholecalciferol (vitamin D3) Take 1,000 Units by mouth daily.   ??? dextroamphetamine-amphetamine Take 1 tablet (10 mg total) by mouth 2 times a day as needed for up to 30 days.   ??? multivitamin Take 1 tablet by mouth daily.   ??? omega-3 fatty acids-fish oil Take 1 capsule by mouth daily.   ??? [START ON 06/25/2020] dextroamphetamine-amphetamine Take 1 tablet (10 mg total) by mouth 2 times a day as needed for up to 30 days.   ??? [START ON 07/25/2020] dextroamphetamine-amphetamine Take 1 tablet (10 mg total) by mouth 2 times a day as needed for up to 30 days.     No current facility-administered medications for this visit.       Allergies:     No Known Allergies    Past Surgical History:     Past Surgical History:   Procedure Laterality Date   ??? EYE SURGERY      Trochlear nerve palsy   ??? Right foot surgery      x2 on right navicular       Social History:     Social History     Socioeconomic History   ??? Marital status: Single     Spouse name: Not on file   ??? Number of children: Not on file   ??? Years of education: Not on file   ??? Highest education level: Not on file   Occupational History   ??? Not on file   Tobacco Use   ??? Smoking status: Never Smoker   ??? Smokeless tobacco: Never Used   Substance and Sexual Activity   ??? Alcohol use: Yes      Comment: 4-5 per week   ??? Drug use: Not Currently   ??? Sexual activity: Yes     Comment: 10 partners in last year   Other Topics Concern   ??? Caffeine Use Not Asked   ??? Occupational Exposure Not Asked   ??? Exercise Yes   ??? 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 Difficulty of Paying Living Expenses:    Food Insecurity:    ??? Worried About Programme researcher, broadcasting/film/video in the Last Year:    ??? Barista in  the Last Year:    Transportation Needs:    ??? Lack of Transportation (Medical):    ??? Lack of Transportation (Non-Medical):    Physical Activity:    ??? Days of Exercise per Week:    ??? Minutes of Exercise per Session:    Stress:    ??? Feeling of Stress :    Social Connections:    ??? Frequency of Communication with Friends and Family:    ??? Frequency of Social Gatherings with Friends and Family:    ??? Attends Religious Services:    ??? Database administrator or Organizations:    ??? Attends Engineer, structural:    ??? Marital Status:    Intimate Programme researcher, broadcasting/film/video Violence:    ??? Fear of Current or Ex-Partner:    ??? Emotionally Abused:    ??? Physically Abused:    ??? Sexually Abused:      Alcohol misuse screening: performed, negative    Family History:     History reviewed. No pertinent family history.    Review of Systems:     ROS - See HPI above    Objective:     Vitals:    04/26/20 1405   BP: 98/48   BP Location: Right arm   Patient Position: Sitting   BP Cuff Size: Regular   Pulse: 97   Temp: 99.1 ??F (37.3 ??C)   SpO2: 96%   Weight: 177 lb (80.3 kg)   Height: 5' 11 (1.803 m)     Wt Readings from Last 8 Encounters:   04/26/20 177 lb (80.3 kg)       Physical Exam  General appearance:  Cooperative, pleasant.  In no distress.  HENT:  Normocephalic, atraumatic. External ears normal.   Eyes:  EOMI. No scleral icterus.   Lungs:  No  wheezes/rales/rhonchi. No respiratory distress.  Heart:  Regular rate and rhythm, S1, S2 normal, no murmurs/rubs/gallops. No peripheral edema.   Skin:  No rashes or lesions.   Extremities:  Warm and well-perfused. No cyanosis or clubbing.   Neuro:  Moves all extremities spontaneously and equally. Normal gait.   Psych:  Normal thought content and behavior.        Assessment & Plan:     The ASCVD Risk score Denman George DC Jr., et al., 2013) failed to calculate for the following reasons:    The 2013 ASCVD risk score is only valid for ages 62 to 108    Alan Reynolds was seen today for new patient visit/ consultation.    Diagnoses and all orders for this visit:    Attention deficit hyperactivity disorder (ADHD), unspecified ADHD type (Primary)  Assessment & Plan:  Well-controlled on current medications. No adverse effects.   - Continue Wellbutrin and Adderall    Orders:  -     dextroamphetamine-amphetamine (ADDERALL) 10 mg Tab; Take 1 tablet (10 mg total) by mouth 2 times a day as needed for up to 30 days.  -     dextroamphetamine-amphetamine (ADDERALL) 10 mg Tab; Take 1 tablet (10 mg total) by mouth 2 times a day as needed for up to 30 days.  -     dextroamphetamine-amphetamine (ADDERALL) 10 mg Tab; Take 1 tablet (10 mg total) by mouth 2 times a day as needed for up to 30 days.  -     buPROPion XL (WELLBUTRIN XL) 150 MG 24 hr tablet; Take 1 tablet (150 mg total) by mouth daily.    Routine screening for STI (sexually transmitted infection)  -  HIV 1+2 Antibody/Antigen with Reflex; Future  -     Hepatitis C Antibody; Future  -     Syphilis Screening Daune Perch); Future  -     Chlamydia / Gonorrhoeae DNA Urine; Future    Stress fracture, unspecified site, sequela  -     Vitamin D 25 hydroxy; Future  -     Renal Function Panel w/EGFR; Future  -     Magnesium; Future    Routine health maintenance  Assessment & Plan:  - STI screening as below         No follow-ups on file.    No future appointments.    No LOS data to display       LOS  MDM    Pre-visit planning performed.  Discussed self management and treatment goals with patient. Barriers to meeting these goals were discussed.  Information regarding the usage and side effects of any new medication were provided.    Signed:  Darrol Angel, MD   04/26/2020, 2:56 PM

## 2020-04-26 NOTE — Assessment & Plan Note (Signed)
-   STI screening as below

## 2020-04-26 NOTE — Assessment & Plan Note (Signed)
Well-controlled on current medications. No adverse effects.   - Continue Wellbutrin and Adderall

## 2020-05-24 MED ORDER — ketoconazole (NIZORAL) 2 % shampoo
2 | Freq: Two times a day (BID) | TOPICAL | 0 refills | 30.00000 days | Status: AC
Start: 2020-05-24 — End: 2022-03-06

## 2020-08-13 ENCOUNTER — Ambulatory Visit

## 2020-09-04 MED ORDER — dextroamphetamine-amphetamine (ADDERALL) 10 mg Tab
10 | ORAL_TABLET | Freq: Two times a day (BID) | ORAL | 0 refills | Status: AC | PRN
Start: 2020-09-04 — End: 2021-04-18

## 2020-09-04 NOTE — Unmapped (Signed)
Patient needs a refill of the adderall

## 2020-10-22 MED ORDER — dextroamphetamine-amphetamine (ADDERALL) 20 mg Tab
20 | ORAL_TABLET | Freq: Two times a day (BID) | ORAL | 0 refills | Status: AC | PRN
Start: 2020-10-22 — End: 2020-11-21

## 2020-11-01 MED ORDER — valACYclovir (VALTREX) 1000 MG tablet
1000 | ORAL_TABLET | Freq: Two times a day (BID) | ORAL | 5 refills | Status: AC
Start: 2020-11-01 — End: 2021-02-26

## 2020-11-01 NOTE — Unmapped (Signed)
Patient is requesting for a new script for Valtrex. He stated that his physician prescribed it for him for cold sore out breaks.

## 2020-11-01 NOTE — Unmapped (Signed)
Pt informed

## 2020-12-17 ENCOUNTER — Other Ambulatory Visit: Admit: 2020-12-17 | Payer: PRIVATE HEALTH INSURANCE

## 2020-12-17 ENCOUNTER — Ambulatory Visit: Admit: 2020-12-17 | Payer: PRIVATE HEALTH INSURANCE

## 2020-12-17 DIAGNOSIS — Z0000 Encounter for general adult medical examination without abnormal findings: Secondary | ICD-10-CM

## 2020-12-17 MED ORDER — dextroamphetamine-amphetamine (ADDERALL) 20 mg Tab
20 | ORAL_TABLET | Freq: Two times a day (BID) | ORAL | 0 refills | Status: AC
Start: 2020-12-17 — End: 2021-11-05

## 2020-12-17 NOTE — Unmapped (Signed)
-   Blood work: See below  - Vaccines: UTD through work  - Exercise: Regularly exercising

## 2020-12-17 NOTE — Unmapped (Signed)
-   Have your blood work drawn down the hall in [address]. Our office will contact you with the results.  - Return in 6 months for ADHD follow up

## 2020-12-17 NOTE — Unmapped (Signed)
[facility]  Department of Internal Medicine  Annual Physical    Chief Complaint:     Chief Complaint   Patient presents with   ??? Follow-up     Medication refill on adderall      History of Present Illness:  Assessment and Plan:     Alan Reynolds is a 30 y.o. male who  has a past medical history of ADHD and Anxiety.     Alan Reynolds was seen today for follow-up.    Diagnoses and all orders for this visit:    Routine health maintenance (Primary)  Assessment & Plan:  - Blood work: See below  - Vaccines: UTD through work  - Exercise: Regularly exercising    Orders:  -     Lipid Profile; Future  -     Basic metabolic panel; Future    Attention deficit hyperactivity disorder (ADHD), unspecified ADHD type  Assessment & Plan:  No headaches, chest pain, palpitations. Takes 15 mg in the morning, 5 mg in the afternoon.   - Continue current medication regimen    Orders:  -     dextroamphetamine-amphetamine (ADDERALL) 20 mg Tab; Take 1 tablet (20 mg total) by mouth in the morning and at bedtime for 30 days.       Return in about 6 months (around 06/16/2021) for ADHD follow up.    No future appointments.    No LOS data to display     LOS MDM    Past Medical History:     Past Medical History:   Diagnosis Date   ??? ADHD    ??? Anxiety      Patient Active Problem List   Diagnosis   ??? ADHD   ??? Routine health maintenance       Medications:     Current Outpatient Medications   Medication Sig   ??? buPROPion XL Take 1 tablet (150 mg total) by mouth daily.   ??? cholecalciferol (vitamin D3) Take 1,000 Units by mouth daily.   ??? ketoconazole Apply topically 2 times a day.   ??? multivitamin Take 1 tablet by mouth daily.   ??? omega-3 fatty acids-fish oil Take 1 capsule by mouth daily.   ??? valACYclovir Take 2 tablets (2,000 mg total) by mouth 2 times a day.   ??? dextroamphetamine-amphetamine Take 1 tablet (20 mg total) by mouth in the morning and at bedtime for 30 days.     No current facility-administered medications for this visit.        Allergies:     No Known Allergies    Past Surgical History:     Past Surgical History:   Procedure Laterality Date   ??? EYE SURGERY      Trochlear nerve palsy   ??? Right foot surgery      x2 on right navicular       Social History:     Social History     Socioeconomic History   ??? Marital status: Single     Spouse name: Not on file   ??? Number of children: Not on file   ??? Years of education: Not on file   ??? Highest education level: Not on file   Occupational History   ??? Not on file   Tobacco Use   ??? Smoking status: Never Smoker   ??? Smokeless tobacco: Never Used   Substance and Sexual Activity   ??? Alcohol use: Yes     Comment: 4-5 per week   ???  Drug use: Not Currently   ??? Sexual activity: Yes     Comment: 10 partners in last year   Other Topics Concern   ??? Caffeine Use Not Asked   ??? Occupational Exposure Not Asked   ??? Exercise Yes   ??? 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 No pertinent family history.    Review of Systems:     Review of Systems   Constitutional: Negative for diaphoresis and weight loss.   HENT: Negative for congestion and sore throat.    Eyes: Negative for blurred vision and double vision.   Respiratory: Negative for cough and shortness of breath.    Cardiovascular: Negative for chest pain, palpitations and leg swelling.   Gastrointestinal: Negative for abdominal pain, constipation, diarrhea, nausea and vomiting.   Genitourinary: Negative for dysuria and frequency.   Musculoskeletal: Negative for joint pain and myalgias.   Skin: Negative for itching and rash.   Neurological: Negative  for dizziness and headaches.   Endo/Heme/Allergies: Does not bruise/bleed easily.   Psychiatric/Behavioral: Negative for depression. The patient is not nervous/anxious and does not have insomnia.        Objective:     Vitals:    12/17/20 1416   BP: 110/78   Pulse: 80   Temp: 98.3 ??F (36.8 ??C)   SpO2: 98%   Weight: 173 lb (78.5 kg)   Height: 5' 11 (1.803 m)     Wt Readings from Last 8 Encounters:   12/17/20 173 lb (78.5 kg)   04/26/20 177 lb (80.3 kg)       Physical Exam  General appearance:  Cooperative, pleasant.  In no distress.  HENT:  Normocephalic, atraumatic. External ears normal.    Eyes:  EOMI. No scleral icterus.   Lungs:  No wheezes/rales/rhonchi. No respiratory distress.  Heart:  Regular rate and rhythm, S1, S2 normal, no murmurs/rubs/gallops. No peripheral edema.   Abdomen:  Soft, non-tender, non-distended. Bowel sounds normal. No rebound or guarding.   Skin:  No rashes or lesions.   Extremities:  Warm and well-perfused. No cyanosis or clubbing.   Neuro:  Moves all extremities spontaneously and equally. Normal gait.   Psych:  Normal thought content and behavior.      The ASCVD Risk score Denman George DC Jr., et al., 2013) failed to calculate for the following reasons:    The 2013 ASCVD risk score is only valid for ages 57 to 37    Pre-visit planning was performed.  I discussed self management and treatment goals with the patient. Barriers to meeting these goals were discussed.  Information regarding the usage and side effects of any new medication was provided.    Signed:  Darrol Angel, MD   12/17/2020, 6:10 PM

## 2020-12-17 NOTE — Unmapped (Signed)
No headaches, chest pain, palpitations. Takes 15 mg in the morning, 5 mg in the afternoon.   - Continue current medication regimen

## 2020-12-18 LAB — BASIC METABOLIC PANEL
Anion Gap: 7 mmol/L (ref 3–16)
BUN: 18 mg/dL (ref 7–25)
CO2: 31 mmol/L (ref 21–33)
Calcium: 10.7 mg/dL (ref 8.6–10.3)
Chloride: 102 mmol/L (ref 98–110)
Creatinine: 1.05 mg/dL (ref 0.60–1.30)
Glucose: 95 mg/dL (ref 70–100)
Osmolality, Calculated: 292 mOsm/kg (ref 278–305)
Potassium: 4.5 mmol/L (ref 3.5–5.3)
Sodium: 140 mmol/L (ref 133–146)
eGFR AA CKD-EPI: >90 See note.
eGFR NONAA CKD-EPI: >90 See note.

## 2020-12-18 LAB — LIPID PANEL
Cholesterol, Total: 151 mg/dL (ref 0–200)
HDL: 70 mg/dL (ref 60–92)
LDL Cholesterol: 69 mg/dL
Triglycerides: 60 mg/dL (ref 10–149)

## 2020-12-20 ENCOUNTER — Ambulatory Visit: Admit: 2020-12-20 | Payer: PRIVATE HEALTH INSURANCE

## 2020-12-20 LAB — RENAL FUNCTION PANEL W/EGFR
Albumin: 4.8 g/dL (ref 3.5–5.7)
Anion Gap: 6 mmol/L (ref 3–16)
BUN: 17 mg/dL (ref 7–25)
CO2: 32 mmol/L (ref 21–33)
Calcium: 9.7 mg/dL (ref 8.6–10.3)
Chloride: 104 mmol/L (ref 98–110)
Creatinine: 1.2 mg/dL (ref 0.60–1.30)
Glucose: 108 mg/dL (ref 70–100)
Osmolality, Calculated: 296 mOsm/kg (ref 278–305)
Phosphorus: 4.1 mg/dL (ref 2.1–4.7)
Potassium: 4.5 mmol/L (ref 3.5–5.3)
Sodium: 142 mmol/L (ref 133–146)
eGFR AA CKD-EPI: >90 See note.
eGFR NONAA CKD-EPI: 81 See note.

## 2020-12-20 LAB — PTH, INTACT: PTH: 39 pg/mL (ref 12.0–88.0)

## 2021-02-15 MED ORDER — buPROPion XL (WELLBUTRIN XL) 150 MG 24 hr tablet
150 | ORAL_TABLET | Freq: Every day | ORAL | 3 refills | Status: AC
Start: 2021-02-15 — End: 2022-02-13

## 2021-02-15 NOTE — Unmapped (Signed)
Last Refilled: 01/16/21  Last visit with Provider: 12/17/2020 Alan Aland, MD  Next appointment in this department: Visit date not found   Last Complete Physical: 12/17/2020

## 2021-02-26 MED ORDER — valACYclovir (VALTREX) 1000 MG tablet
1000 | ORAL_TABLET | Freq: Two times a day (BID) | ORAL | 5 refills | Status: AC
Start: 2021-02-26 — End: 2021-08-05
  Filled 2021-02-26: qty 2, 1d supply, fill #0

## 2021-02-27 MED ORDER — dextroamphetamine-amphetamine (ADDERALL) 20 mg Tab
20 | ORAL_TABLET | Freq: Two times a day (BID) | ORAL | 0 refills | Status: AC | PRN
Start: 2021-02-27 — End: 2021-04-23

## 2021-04-18 MED ORDER — dextroamphetamine-amphetamine (ADDERALL) 10 mg Tab
10 | ORAL_TABLET | Freq: Two times a day (BID) | ORAL | 0 refills | Status: AC | PRN
Start: 2021-04-18 — End: 2021-04-23

## 2021-04-18 NOTE — Unmapped (Signed)
Last Refilled: 12/17/20  Last visit with Provider: 12/17/2020 Levi Aland, MD  Next appointment in this department: Visit date not found   Last Complete Physical: 12/17/2020

## 2021-04-19 NOTE — Unmapped (Signed)
-----   Message from Levi Aland, MD sent at 04/18/2021  7:47 PM EDT -----  Regarding: ADHD Follow up  Will you call patient and schedule follow up for ADHD around 8/14.

## 2021-04-19 NOTE — Unmapped (Signed)
LVM for pt to call back and schedule

## 2021-04-23 MED ORDER — dextroamphetamine-amphetamine (ADDERALL) 20 mg Tab
20 | ORAL_TABLET | Freq: Two times a day (BID) | ORAL | 0 refills | Status: AC | PRN
Start: 2021-04-23 — End: 2021-05-29

## 2021-04-23 NOTE — Unmapped (Signed)
Addended by: Darrol Angel A on: 04/23/2021 01:07 PM     Modules accepted: Orders

## 2021-05-29 ENCOUNTER — Ambulatory Visit: Admit: 2021-05-29 | Payer: PRIVATE HEALTH INSURANCE

## 2021-05-29 ENCOUNTER — Other Ambulatory Visit: Admit: 2021-05-29 | Payer: PRIVATE HEALTH INSURANCE

## 2021-05-29 DIAGNOSIS — Z114 Encounter for screening for human immunodeficiency virus [HIV]: Secondary | ICD-10-CM

## 2021-05-29 DIAGNOSIS — Z113 Encounter for screening for infections with a predominantly sexual mode of transmission: Secondary | ICD-10-CM

## 2021-05-29 LAB — TREPONEMA PALLIDUM AB WITH REFLEX: Treponema Pallidum: NEGATIVE

## 2021-05-29 LAB — CHLAMYDIA / GONORRHOEAE DNA URINE
Chlamydia Trachomatis DNA Urine: NEGATIVE
Neisseria gonorrhoeae DNA Urine: NEGATIVE

## 2021-05-29 LAB — HIV 1+2 ANTIBODY/ANTIGEN WITH REFLEX: HIV 1+2 AB/AGN: NONREACTIVE

## 2021-05-29 MED ORDER — dextroamphetamineamphetamineADDERALL20mgTab
20 | ORAL_TABLET | Freq: Two times a day (BID) | ORAL | 0 refills | Status: AC | PRN
Start: 2021-05-29 — End: 2021-08-27

## 2021-05-29 NOTE — Unmapped (Signed)
No headache, lightheadedness, chest pain, palpitations, appetite suppression, or insomnia. Only using the medications as needed for work  A/P: Doing well on current medications.   - Refill ADHD medications today

## 2021-05-29 NOTE — Unmapped (Signed)
[facility]  Department of Internal Medicine  Follow Up Visit    Chief Complaint:     Chief Complaint   Patient presents with   ??? Medication Refill      History of Present Illness:  Assessment and Plan:      Alan Reynolds was seen today for medication refill.    Diagnoses and all orders for this visit:    Screening for STD (sexually transmitted disease) (Primary)  Assessment & Plan:  - Screening tests for STIs ordered per patient request    Orders:  -     HIV 1+2 Antibody/Antigen with Reflex; Future  -     Chlamydia / Gonorrhoeae DNA Urine; Standing  -     Syphilis Screening Daune Perch); Future  -     Chlamydia / Gonorrhoeae DNA Urine  -     Chlamydia / Gonorrhoeae DNA Urine; Future    Attention deficit hyperactivity disorder (ADHD), unspecified ADHD type  Assessment & Plan:  No headache, lightheadedness, chest pain, palpitations, appetite suppression, or insomnia. Only using the medications as needed for work  A/P: Doing well on current medications.   - Refill ADHD medications today    Orders:  -     dextroamphetamine-amphetamine (ADDERALL) 20 mg Tab; Take 1 tablet (20 mg total) by mouth 2 times a day as needed.       No follow-ups on file.    No future appointments.    No LOS data to display    LOS MDM    Review of Systems:     ROS - As above in HPI    Medications:     Current Outpatient Medications   Medication Sig   ??? buPROPion XL Take 1 tablet (150 mg total) by mouth daily.   ??? cholecalciferol (vitamin D3) Take 1,000 Units by mouth daily.   ??? dextroamphetamine-amphetamine Take 1 tablet (20 mg total) by mouth 2 times a day as needed.   ??? ketoconazole Apply topically 2 times a day.   ??? multivitamin Take 1 tablet by mouth daily.   ??? valACYclovir Take 2 tablets (2,000 mg total) by mouth 2 times a day.     No current facility-administered medications for this visit.       Past Medical History, Family History, and Social History     I have reviewed the patient???s medical history with the patient and updated any  pertinent past medical, family, and social history in the EMR.      Objective:     Vitals:    05/29/21 1147   BP: 134/88   Pulse: 78   Temp: 97.1 ??F (36.2 ??C)   SpO2: 98%   Weight: 184 lb (83.5 kg)   Height: 5' 11 (1.803 m)     Wt Readings from Last 8 Encounters:   05/29/21 184 lb (83.5 kg)   12/17/20 173 lb (78.5 kg)   04/26/20 177 lb (80.3 kg)       Physical Exam  General:  Cooperative, pleasant.  In no distress.  HENT:  Normocephalic, atraumatic. External ears normal.   Eyes:  EOMI. No scleral icterus.   Extremities:  No cyanosis or clubbing.   Neuro:  Moves all extremities spontaneously and equally. Answers questions appropriately.     BMI is above normal. Weight loss not recommended at this time.    Pre-visit planning was performed.  I discussed self management and treatment goals with patient. Barriers to meeting these goals were discussed.  Information regarding  the usage and side effects of any new medication was provided.    Signed:  Darrol Angel, MD  05/29/2021, 4:54 PM

## 2021-05-29 NOTE — Unmapped (Signed)
-   Screening tests for STIs ordered per patient request

## 2021-05-30 MED ORDER — dextroamphetamine-amphetamine (ADDERALL) 20 mg Tab
20 | ORAL_TABLET | Freq: Two times a day (BID) | ORAL | 0 refills | Status: AC | PRN
Start: 2021-05-30 — End: 2021-08-29

## 2021-05-30 MED ORDER — dextroamphetamine-amphetamine (ADDERALL) 20 mg Tab
20 | ORAL_TABLET | Freq: Two times a day (BID) | ORAL | 0 refills | Status: AC | PRN
Start: 2021-05-30 — End: 2021-07-28

## 2021-05-30 NOTE — Unmapped (Signed)
Pharmacy called, they cannot dispense 90 days of the adderall. They will dispense 30 now and will need 2 more scripts with do not dispense date 30 days apart.

## 2021-05-30 NOTE — Unmapped (Signed)
Resent

## 2021-06-14 MED ORDER — valACYclovir (VALTREX) 500 MG tablet
500 | ORAL_TABLET | Freq: Two times a day (BID) | ORAL | 3 refills | Status: AC
Start: 2021-06-14 — End: 2022-03-06
  Filled 2021-06-14: qty 6, 3d supply, fill #0

## 2021-08-05 MED ORDER — valACYclovir (VALTREX) 1000 MG tablet
1000 | ORAL_TABLET | ORAL | 5 refills | Status: AC
Start: 2021-08-05 — End: 2022-03-06

## 2021-08-29 MED ORDER — dextroamphetamine-amphetamine (ADDERALL) 20 mg Tab
20 | ORAL_TABLET | Freq: Two times a day (BID) | ORAL | 0 refills | Status: AC | PRN
Start: 2021-08-29 — End: 2021-08-30

## 2021-08-30 MED ORDER — dextroamphetamine-amphetamine (ADDERALL) 20 mg Tab
20 | ORAL_TABLET | Freq: Two times a day (BID) | ORAL | 0 refills | Status: AC | PRN
Start: 2021-08-30 — End: 2021-09-29

## 2021-08-30 NOTE — Unmapped (Signed)
Addended by: Darrol Angel A on: 08/30/2021 02:32 PM     Modules accepted: Orders

## 2021-08-30 NOTE — Unmapped (Signed)
Rx has been cancelled

## 2021-08-30 NOTE — Unmapped (Addendum)
Will you call and cancel the Adderall prescription at [facility]? He wanted me to send it to a different pharmacy. Route back to me when done.

## 2021-08-30 NOTE — Unmapped (Signed)
Addended by: Darrol Angel A on: 08/30/2021 08:20 AM     Modules accepted: Orders

## 2021-09-16 ENCOUNTER — Ambulatory Visit: Admit: 2021-09-16 | Discharge: 2021-09-16 | Payer: PRIVATE HEALTH INSURANCE

## 2021-09-16 DIAGNOSIS — Z23 Encounter for immunization: Secondary | ICD-10-CM

## 2021-11-06 MED ORDER — dextroamphetamine-amphetamine (ADDERALL) 20 mg Tab
20 | ORAL_TABLET | Freq: Two times a day (BID) | ORAL | 0 refills | Status: AC
Start: 2021-11-06 — End: 2021-11-14

## 2021-11-06 NOTE — Unmapped (Signed)
Patient sent a message my chart asking for refill of the adderall. It is pending, he is calling to see if he could get that refill.

## 2021-11-08 MED ORDER — dextroamphetamine-amphetamine (ADDERALL) 10 mg Tab
10 | ORAL_TABLET | Freq: Two times a day (BID) | ORAL | 0 refills | Status: AC
Start: 2021-11-08 — End: 2021-11-14

## 2021-11-08 NOTE — Unmapped (Signed)
Pt called in regarding script for adderall. Alan Reynolds said insurance is trying to bill 240 for 120 quantity. Needs a scripts for 90 tablets 3 times a day per pt which would only cost 20 dollars.

## 2021-11-08 NOTE — Unmapped (Signed)
Addended by: Darrol AngelSPARKS, Emile Kyllo A on: 11/08/2021 08:21 AM     Modules accepted: Orders

## 2021-11-11 MED ORDER — dextroamphetamine-amphetamine (ADDERALL) 10 mg Tab
10 | ORAL_TABLET | Freq: Three times a day (TID) | ORAL | 0 refills | Status: AC
Start: 2021-11-11 — End: 2021-11-14

## 2021-11-11 NOTE — Unmapped (Signed)
Patient is currently in [place] and is looking to get medication sent there. Patient is going to message with pharmacy that they want it sent to.

## 2021-11-11 NOTE — Unmapped (Signed)
No problem. I sent 90 of the 10 mg Adderall pills.

## 2021-11-14 MED ORDER — dextroamphetamine-amphetamine (ADDERALL) 20 mg Tab
20 | ORAL_TABLET | Freq: Two times a day (BID) | ORAL | 0 refills | Status: AC
Start: 2021-11-14 — End: 2021-11-15

## 2021-11-14 NOTE — Unmapped (Signed)
Called patient and clarified plan for Adderall. Refill sent.

## 2021-11-15 MED ORDER — dextroamphetamine-amphetamine (ADDERALL) 10 mg Tab
10 | ORAL_TABLET | Freq: Two times a day (BID) | ORAL | 0 refills | Status: AC
Start: 2021-11-15 — End: 2022-01-20

## 2022-01-21 MED ORDER — dextroamphetamine-amphetamine (ADDERALL) 10 mg Tab
10 | ORAL_TABLET | Freq: Two times a day (BID) | ORAL | 0 refills
Start: 2022-01-21 — End: 2022-01-22

## 2022-01-22 MED ORDER — dextroamphetamine-amphetamine (ADDERALL) 20 mg Tab
20 | ORAL_TABLET | Freq: Two times a day (BID) | ORAL | 0 refills | Status: AC
Start: 2022-01-22 — End: 2022-03-06

## 2022-01-22 NOTE — Unmapped (Signed)
LVM for patient to call back

## 2022-01-22 NOTE — Unmapped (Signed)
The adderall that was sent to pharmacy was sent for tablets a day. The insurance will not pay for more than 3 tablets a day. Please change and resend

## 2022-01-22 NOTE — Unmapped (Signed)
Then we'll have to see if they have Adderall 20 mg tablets so that we can get the same dose (20 mg twice daily) but with lower quantity of pills. Sending now.

## 2022-01-22 NOTE — Unmapped (Signed)
Sent patient a MyChart message informing that a prior authorization has been approved for Adderall until 01/23/2023. Click on RX Prior Authorization link below for prior auth details.

## 2022-01-24 NOTE — Unmapped (Signed)
LVM for patient to call back

## 2022-01-28 NOTE — Unmapped (Signed)
Lvm for patient to make sure that they received their medication from the pharmacy.

## 2022-02-13 MED ORDER — buPROPion XL (WELLBUTRIN XL) 150 MG 24 hr tablet
150 | ORAL_TABLET | Freq: Every day | ORAL | 3 refills | Status: AC
Start: 2022-02-13 — End: ?

## 2022-02-13 NOTE — Unmapped (Signed)
Last Refilled: 10/28/21  Last visit with Provider: 05/29/2021 Levi Aland, MD  Next appointment in this department: 03/06/2022 Levi Aland, MD   Last Complete Physical: 12/17/2020

## 2022-03-06 ENCOUNTER — Ambulatory Visit: Admit: 2022-03-06 | Payer: PRIVATE HEALTH INSURANCE

## 2022-03-06 DIAGNOSIS — Z0000 Encounter for general adult medical examination without abnormal findings: Secondary | ICD-10-CM

## 2022-03-06 MED ORDER — valACYclovir (VALTREX) 1000 MG tablet
1000 | ORAL_TABLET | Freq: Two times a day (BID) | ORAL | 5 refills | Status: AC
Start: 2022-03-06 — End: ?

## 2022-03-06 MED ORDER — dextroamphetamine-amphetamine (ADDERALL) 20 mg Tab
20 | ORAL_TABLET | Freq: Two times a day (BID) | ORAL | 0 refills | Status: AC
Start: 2022-03-06 — End: 2022-04-05

## 2022-03-06 NOTE — Unmapped (Signed)
Well-controlled on current medications. 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 10-15 mg otherwise. No headache, lightheadedness, chest pain, palpitations, appetite suppression, or insomnia.   A/P:  Well-controlled.   - Continue current medications

## 2022-03-06 NOTE — Unmapped (Signed)
[facility]  Department of Internal Medicine  Annual Physical    Chief Complaint:     Chief Complaint   Patient presents with   ??? Annual Exam     History of Present Illness:  Assessment and Plan:     Alan Reynolds was seen today for annual exam.    Diagnoses and all orders for this visit:    Routine general medical examination at a health care facility (Primary)  Assessment & Plan:  - Blood work: See below  - Vaccines: UTD  - Exercise: At goal    Orders:  -     CBC; Future  -     Comprehensive metabolic panel; Future  -     Hemoglobin A1c; Future  -     Lipid Profile; Future  -     Testosterone, Total; Future    Rectal bleeding  -     GI Office Visit/Consult    Attention deficit hyperactivity disorder (ADHD), unspecified ADHD type  Assessment & Plan:  Well-controlled on current medications. 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 10-15 mg otherwise. No headache, lightheadedness, chest pain, palpitations, appetite suppression, or insomnia.   A/P:  Well-controlled.   - Continue current medications    Orders:  -     Thyroid Function Cascade; Future  -     dextroamphetamine-amphetamine (ADDERALL) 20 mg Tab; Take 1 tablet (20 mg total) by mouth 2 times a day for 30 days.    Other orders  -     valACYclovir (VALTREX) 1000 MG tablet; Take 2 tablets (2,000 mg total) by mouth 2 times a day.       Return in about 6 months (around 09/06/2022) for Follow up.    No future appointments.    No LOS data to display       LOS MDM    Past Medical History:     Past Medical History:   Diagnosis Date   ??? ADHD    ??? Anxiety      Patient Active Problem List   Diagnosis   ??? ADHD   ??? Routine general medical examination at a health care facility       Medications:     Current Outpatient Medications   Medication Sig   ??? buPROPion XL Take 1 tablet (150 mg total) by mouth daily.   ??? cholecalciferol (vitamin D3) Take 1 tablet (1,000 Units total) by mouth daily.   ??? dextroamphetamine-amphetamine Take 1 tablet (20 mg total) by mouth 2 times a day  for 30 days.   ??? multivitamin Take 1 tablet by mouth daily.   ??? valACYclovir Take 2 tablets (2,000 mg total) by mouth 2 times a day.     No current facility-administered medications for this visit.       Allergies:     No Known Allergies    Past Surgical History:     Past Surgical History:   Procedure Laterality Date   ??? EYE SURGERY      Trochlear nerve palsy   ??? Right foot surgery      x2 on right navicular       Social History:     Social History     Socioeconomic History   ??? Marital status: Single     Spouse name: Not on file   ??? Number of children: Not on file   ??? Years of education: Not on file   ??? Highest education level: Not on file  Occupational History   ??? Not on file   Tobacco Use   ??? Smoking status: Never   ??? Smokeless tobacco: Never   Substance and Sexual Activity   ??? Alcohol use: Yes     Comment: 4-5 per week   ??? Drug use: Not Currently   ??? Sexual activity: Yes     Comment: 10 partners in last year   Other Topics Concern   ??? Caffeine Use Not Asked   ??? Occupational Exposure Not Asked   ??? Exercise Yes   ??? 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 Robinson Worldwidefile   Food Insecurity: Not on file   Transportation Needs: Not on file   Intimate Partner Violence: Not on file   Housing Stability: Not on file       Alcohol misuse screening: performed, negative    Family History:     History reviewed. No pertinent family history.    Review of Systems:     Review of Systems   Constitutional: Negative for diaphoresis and weight loss.   HENT: Negative for congestion, hearing loss and sore throat.    Eyes: Negative for blurred vision and double vision.   Respiratory: Negative for cough and shortness of breath.    Cardiovascular: Negative for chest pain and palpitations.   Gastrointestinal: Negative for abdominal pain,  constipation, diarrhea and nausea.   Genitourinary: Negative for dysuria and frequency.   Musculoskeletal: Negative for joint pain and myalgias.   Skin: Negative for itching and rash.        No changing moles.    Neurological: Negative for dizziness and headaches.   Endo/Heme/Allergies: Negative for environmental allergies.   Psychiatric/Behavioral: Negative for depression. The patient is not nervous/anxious and does not have insomnia.        Objective:     Vitals:    03/06/22 1516   BP: 130/84   Pulse: 81   Temp: 97.8 ??F (36.6 ??C)   SpO2: 96%   Weight: 182 lb (82.6 kg)   Height: 5' 11 (1.803 m)     Wt Readings from Last 8 Encounters:   03/06/22 182 lb (82.6 kg)   05/29/21 184 lb (83.5 kg)   12/17/20 173 lb (78.5 kg)   04/26/20 177 lb (80.3 kg)       Physical Exam  General appearance:  Cooperative, pleasant.  In no distress.  HENT:  Normocephalic, atraumatic. External ears normal.   Eyes:  EOMI. No scleral icterus.   Neck:  Trachea midline. No lymphadenopathy. No thyromegaly.   Lungs:  No wheezes/rales/rhonchi. No respiratory distress.  Heart:  Regular rate and rhythm, S1, S2 normal, no murmurs/rubs/gallops. No peripheral edema.   Abdomen:  Soft, non-tender, non-distended. Bowel sounds normal. No rebound or guarding.   Skin:  No rashes or lesions.   Extremities:  Warm and well-perfused. No cyanosis or clubbing.   Neuro:  Moves all extremities spontaneously and equally. Normal gait.   Psych:  Normal thought content and behavior.      BMI is normal.    Pre-visit planning was performed.  I discussed self management and treatment goals with the patient. Barriers to meeting these goals were discussed.  Information regarding the usage and side effects  of any new medication was provided.    Signed:  Darrol Angel, MD   03/06/2022, 3:46 PM

## 2022-03-06 NOTE — Unmapped (Addendum)
-   Have your blood work drawn between 8 am and 10 am. Our office will contact you with the results.  - Follow up in 6 months

## 2022-03-06 NOTE — Unmapped (Signed)
-   Blood work: See below  - Vaccines: UTD  - Exercise: At goal

## 2022-03-14 ENCOUNTER — Ambulatory Visit: Admit: 2022-03-14 | Payer: PRIVATE HEALTH INSURANCE

## 2022-03-14 DIAGNOSIS — Z0000 Encounter for general adult medical examination without abnormal findings: Secondary | ICD-10-CM

## 2022-03-14 LAB — HEMOGLOBIN A1C: Hemoglobin A1C: 5 % (ref 4.0–5.6)

## 2022-03-14 LAB — CBC
Hematocrit: 47.5 % (ref 38.5–50.0)
Hemoglobin: 16.3 g/dL (ref 13.2–17.1)
MCH: 31.2 pg (ref 27.0–33.0)
MCHC: 34.3 g/dL (ref 32.0–36.0)
MCV: 90.9 fL (ref 80.0–100.0)
MPV: 8 fL (ref 7.5–11.5)
Platelets: 243 10*3/uL (ref 140–400)
RBC: 5.22 10*6/uL (ref 4.20–5.80)
RDW: 12.4 % (ref 11.0–15.0)
WBC: 7.8 10*3/uL (ref 3.8–10.8)

## 2022-03-14 LAB — COMPREHENSIVE METABOLIC PANEL
ALT: 23 U/L (ref 7–52)
AST: 26 U/L (ref 13–39)
Albumin: 4.8 g/dL (ref 3.5–5.7)
Alkaline Phosphatase: 44 U/L (ref 36–125)
Anion Gap: 8 mmol/L (ref 3–16)
BUN: 25 mg/dL (ref 7–25)
CO2: 27 mmol/L (ref 21–33)
Calcium: 9.9 mg/dL (ref 8.6–10.3)
Chloride: 102 mmol/L (ref 98–110)
Creatinine: 1.32 mg/dL (ref 0.60–1.30)
EGFR: 74
Glucose: 83 mg/dL (ref 70–100)
Osmolality, Calculated: 288 mOsm/kg (ref 278–305)
Potassium: 5 mmol/L (ref 3.5–5.3)
Sodium: 137 mmol/L (ref 133–146)
Total Bilirubin: 0.7 mg/dL (ref 0.0–1.5)
Total Protein: 7 g/dL (ref 6.4–8.9)

## 2022-03-14 LAB — LIPID PANEL
Cholesterol, Total: 145 mg/dL (ref 0–200)
HDL: 60 mg/dL (ref 60–92)
LDL Cholesterol: 76 mg/dL
Non-HDL Cholesterol, Calculated: 85 mg/dL (ref 0–129)
Triglycerides: 44 mg/dL (ref 10–149)

## 2022-03-14 LAB — TESTOSTERONE: Testosterone: 487 ng/dL (ref 175–781)

## 2022-03-14 LAB — THYROID FUNCTION CASCADE: TSH: 2.67 u[IU]/mL (ref 0.45–4.12)

## 2022-06-30 NOTE — Unmapped (Signed)
Wrong appointment or visit type was used, rescheduled for correct template. Error - Please disregard.

## 2022-07-01 MED ORDER — dextroamphetamine-amphetamine (ADDERALL) 20 mg Tab
20 | ORAL_TABLET | Freq: Two times a day (BID) | ORAL | 0 refills
Start: 2022-07-01 — End: 2022-07-02

## 2022-07-02 MED ORDER — dextroamphetamine-amphetamine (ADDERALL) 20 mg Tab
20 | ORAL_TABLET | Freq: Two times a day (BID) | ORAL | 0 refills | Status: AC
Start: 2022-07-02 — End: 2022-09-08
  Filled 2022-07-02: qty 60, 30d supply, fill #0

## 2022-07-02 NOTE — Unmapped (Signed)
Addended by: Darrol AngelSPARKS, Aydan Phoenix A on: 07/02/2022 02:47 PM     Modules accepted: Orders

## 2022-08-27 ENCOUNTER — Ambulatory Visit: Admit: 2022-08-27 | Discharge: 2022-08-28 | Payer: PRIVATE HEALTH INSURANCE

## 2022-08-27 DIAGNOSIS — L988 Other specified disorders of the skin and subcutaneous tissue: Secondary | ICD-10-CM

## 2022-08-27 NOTE — Unmapped (Signed)
[place]   Oral Maxillofacial Surgery        Pt. Name: Alan Reynolds  Pt. MRN: [id number]  DOB: 09-02-1991  Sex: Male  Provider: Lelon HuhJames Phero, DDS, MD  Location of Care: [facility]        Procedure: Botox injections  Date of Procedure: 08/27/22     H&P/Consult within 30 days of procedure: No (no changes)     Informed Consent/Counseling Statement:  Plan, alternatives and risks of procedure have been explained to and discussed with the patient/legal guardian.  By my assessment, the patient/legal guardian understands and agrees. Scenario presented in detail. Question answered.     Anesthesia: none     Botox:  NDC [id number]  Lot [id number]  Exp 2026/01     Botox injections:  Cleansed sites of injection b/l with alcohol prep wipe. Marked locations for injection of botox.      Re-suspended/reconstituted one 50 unit vial of botox with 0.255ml of normal saline. botox solution was then drawn into two TB syringe for a concentration ratio of 1 unit per 0.461ml.      16u frontalis divided into 8 sites  4u orbicularis oculi divided into 4 sites  5u procerus into 1 site  8u corrugators into 2 sites     A total of 33 units injected.     Complications/Abnormal findings: N/A  Estimated Blood Loss: Minimal  Patient tolerated anesthesia and procedure well.     HY:QMVHRx:None   Disposition: Patient to follow up on an as needed basis    Alan Reynolds MS3  08/27/22 4:00PM

## 2022-09-08 ENCOUNTER — Ambulatory Visit
Admit: 2022-09-08 | Discharge: 2022-09-19 | Payer: PRIVATE HEALTH INSURANCE | Attending: Student in an Organized Health Care Education/Training Program

## 2022-09-08 DIAGNOSIS — K625 Hemorrhage of anus and rectum: Secondary | ICD-10-CM

## 2022-09-08 MED ORDER — hydrocortisone (ANUSOL-HC) 2.5 % rectal cream
2.5 | Freq: Two times a day (BID) | TOPICAL | 0 refills | 15.00000 days | Status: AC
Start: 2022-09-08 — End: 2022-10-08

## 2022-09-08 NOTE — Unmapped (Signed)
Dietary and lifestyle modification to avoid constipation:  - 20 to 30 g of insoluble fiber per day and drink plenty of water (6-8 glasses per day)  - I recommend either psyllium (MetaMucil) or methylcellulose (Citrucel) started at a low dose and slowly increased over 4-8 weeks to avoid bloating  - Regular exercise  - Avoiding straining with bowel movements or lingering on the toilet for long periods of time    Hemorrhoid recommendations  Dietary and lifestyle modification to avoid constipation:  - 20 to 30 g of insoluble fiber per day and drink plenty of water (6-8 glasses per day)  - I recommend either psyllium (MetaMucil) or methylcellulose (Citrucel) started at a low dose and slowly increased over 4-8 weeks to avoid bloating  - Regular exercise  - Avoiding straining with bowel movements or lingering on the toilet for long periods of time  - warm sitz (magnesium chloride salt) baths if pruritus/discomfort  - hydrocortisone 2.5% cream topically twice daily for 7-14 days  - if that doesn't work, we can try hydrocortisone 25 mg suppository (Anusol-HC) daily for 7-14 days  - if the bleeding persists, we will do colonoscopy to confirm hemorrhoids as cause of hematochezia and referral to Colorectal Surgery for consideration of office based band ligation and/or hemorrhoidectomy given the size and combination of internal/external    Peri-anal hygiene  - I again recommended the bulk forming laxative, and hopefully shrinking the hemorrhoids will help  - I discouraged scratching the area, despite pruritis, which should improve with hemorrhoid treatment and sitz bath  - I also discouraged using wet wipes, or at least using dry toilet paper after using a wet wipe

## 2022-09-08 NOTE — Unmapped (Signed)
[facility]  GASTROENTEROLOGY CLINIC NOTE    Reason for Visit: Rectal Bleeding  Last Clinic Visit: NPV once a day    HPI:   Alan Reynolds is a 31 y.o. male w/ a PMHx of ADHD who presents for evaluation of hematochezia.    In terms of further HPI, the patient endorses that he has had on and off blood in his stool over the past 3-4 years. The color of the stool is brown, but upon wiping he endorses red streaks of blood and more recently has had instances of red blood coating brown stool and coating the toilet bowl. He denies any pain or increasing urgency with Bms. No diarrhea or melena. No fevers or chills or abdominal pain. He does occasionally strain and works out heavy engaging in heavy load movements such as squats/dead lift. He has never used any steroid cream or fiber supplementation. He goes to the bathroom about once a day but does endorse hard stools. Of note, no family history of colon cancer or IBD or autoimmune diseases. No history of colonoscopy or upper endoscopy.     GI Diagnostics/Endoscopy History:  N/A    Past Medical History:   Diagnosis Date    ADHD     Anxiety      Past Surgical History:   Procedure Laterality Date    EYE SURGERY      Trochlear nerve palsy    Right foot surgery      x2 on right navicular     No family history on file.  Social History     Tobacco Use    Smoking status: Never    Smokeless tobacco: Never   Substance Use Topics    Alcohol use: Yes     Comment: 4-5 per week    Drug use: Never     No Known Allergies    Current Medications  Current Outpatient Medications   Medication Sig    buPROPion XL Take 1 tablet (150 mg total) by mouth daily.    cholecalciferol (vitamin D3) Take 1 tablet (1,000 Units total) by mouth daily.    dextroamphetamine-amphetamine Take 1 tablet (20 mg total) by mouth 2 times a day for 30 days.    multivitamin Take 1 tablet by mouth daily.    valACYclovir Take 2 tablets (2,000 mg total) by mouth 2 times a day.     No current  facility-administered medications for this visit.       Physical Examination  Blood pressure 115/68, pulse 94, height 6' (1.829 m), weight 186 lb (84.4 kg), SpO2 97 %.    Gen: Well-developed, well nourished. No acute distress.   HEENT: NC/AT, no scleral icterus, moist mucus membranes  CV: RRR, no LE edema  Lungs: Non-labored on room air   Abdomen: soft, nontender, non-distended   Rectal: No anal fissure noted. External hemorrhoid appreciated. No internal hemorrhoid or masses felt. DRE without blood or stool present.   Extremities: warm, well perfused  Neuro: A&Ox3, moving all extremities spontaneous, no asterixis   Skin: no bruising/rashes/jaundice, no palmar erythema, no spider angiomata  Psych: normal affect    Labs/Imaging  Lab Results   Component Value Date    GLUCOSE 83 03/14/2022    BUN 25 03/14/2022    CO2 27 03/14/2022    CREATININE 1.32 (H) 03/14/2022    K 5.0 03/14/2022    NA 137 03/14/2022    CL 102 03/14/2022    CALCIUM 9.9 03/14/2022  Lab Results   Component Value Date    ALKPHOS 44 03/14/2022    ALT 23 03/14/2022    AST 26 03/14/2022    BILITOT 0.7 03/14/2022    ALBUMIN 4.8 03/14/2022    PROT 7.0 03/14/2022     Lab Results   Component Value Date    MG 2.2 04/26/2020     Lab Results   Component Value Date    PHOS 4.1 12/20/2020     Lab Results   Component Value Date    WBC 7.8 03/14/2022    HGB 16.3 03/14/2022    HCT 47.5 03/14/2022    MCV 90.9 03/14/2022    PLT 243 03/14/2022     No results found for: PTT, INR    Assessment/Plan  Alan Reynolds is a 31 y.o. male w/ a PMHx of ADHD who presents for evaluation of rectal outlet bleeding. Examination remarkable for external hemorrhoid present. At this time, will treat external hemorrhoid with hydrocortisone 2.5% topical cream daily for 7-14 days and use of fiber.   - if the bleeding persists, we will do colonoscopy to confirm hemorrhoids as cause of hematochezia and referral to Colorectal Surgery for consideration of office based band ligation and/or  hemorrhoidectomy given the size and combination of internal/external    Plan to be discussed with Dr. Allison QuarryZandvakili, recommendations final after attending attestation.     Maren ReamerAdam S. Amarachi Kotz MD  Gastroenterology Fellow, PGY-5  09/08/2022, 1:37 PM

## 2022-09-08 NOTE — Unmapped (Signed)
I saw and evaluated the patient, and discussed with the fellow. I agree with the fellows findings and plan as documented in the fellows note, with the follow exceptions or additions.    # Hematochezia, rectal outlet type    Alan Reynolds is a 31 y.o. year-old male with a history of ADHD who presents for evaluation of hematochezia.    Briefly, he describes 3-4 years of intermittent rectal outlet hematochezia, which he describes initially scant blood outside of brown bowel movements, however, in recent times he has had more bleeding. He does strain with bowel movements sometimes. He lifts weights.    He denies abdominal pain, constipation, diarrhea, melena or hematochezia. Denies any fevers, chills, arthralgia or weight loss.    Medications include no antithrombotics.    Family history is negative for colorectal cancer or IBD.    Labs are notable for hemoglobin 16 g/dL with normal MCV.    No imaging, and no prior endoscopies.    Exam is notable for clear external hemorrhoids, no prolapsing internal hemorrhoids, no visible fissure.  The fellow performed a DRE and there is no palpable mass or internal hemorrhoids.    My assessment is that he has rectal outlet bleeding and hemorrhoids on exam, and otherwise low risk patient for malignancy. His hematochezia is very likely hemorrhoidal.  I recommended lifestyle management and hydrocortisone cream given the external nature of his hemorrhoids and if this does not work then he can try hydrocortisone suppositories.  I discussed with him however if the bleeding continues after 2 months then he should contact us back for consideration of colonoscopy or referral to Colorectal Surgery for anoscopy.    Salli QuarryInuk Tiyanna Larcom MD PhD  General Gastroenterology Attending  [place]

## 2022-09-15 MED ORDER — dextroamphetamine-amphetamine (ADDERALL) 20 mg Tab
20 | ORAL_TABLET | Freq: Two times a day (BID) | ORAL | 0 refills | Status: AC
Start: 2022-09-15 — End: 2022-10-15

## 2022-09-15 NOTE — Unmapped (Signed)
Refill sent. Patient needs ADHD follow up appt. Please call pt to schedule.

## 2022-09-16 NOTE — Unmapped (Signed)
Left vm for pt to call back and schedule appt

## 2022-09-18 NOTE — Unmapped (Signed)
Pt is scheduled for next follow up appt

## 2022-10-06 ENCOUNTER — Ambulatory Visit: Admit: 2022-10-06 | Payer: PRIVATE HEALTH INSURANCE

## 2022-10-06 ENCOUNTER — Ambulatory Visit: Admit: 2022-10-07 | Payer: PRIVATE HEALTH INSURANCE

## 2022-10-06 DIAGNOSIS — Z113 Encounter for screening for infections with a predominantly sexual mode of transmission: Secondary | ICD-10-CM

## 2022-10-06 DIAGNOSIS — F900 Attention-deficit hyperactivity disorder, predominantly inattentive type: Secondary | ICD-10-CM

## 2022-10-06 LAB — CHLAMYDIA/N.GONORRHOEAE DNA, NAAT, PHARYNGEAL
Chlamydia Trachomatis DNA: NEGATIVE
Neisseria Gonorrhoeae DNA: NEGATIVE

## 2022-10-06 NOTE — Unmapped (Signed)
On Adderall 10 mg - 15 mg every morning, sometimes an additional 5 mg in the afternoon. No headache, lightheadedness, chest pain, palpitations, appetite suppression, or insomnia.    - Continue current medications  - Return in 6 months for Annual physical

## 2022-10-06 NOTE — Unmapped (Addendum)
-   Have blood work done between 8 am and 10 am. No need to fast.   - Return in 6 months for an Annual physical.

## 2022-10-06 NOTE — Unmapped (Signed)
[facility] [place]  Department of Internal Medicine  Follow Up Visit    Chief Complaint:     Chief Complaint   Patient presents with    Follow-up     Adhd      History of Present Illness:  Assessment and Plan:      Alan Reynolds was seen today for follow-up.    Diagnoses and all orders for this visit:    Attention deficit hyperactivity disorder (ADHD), predominantly inattentive type (Primary)  Assessment & Plan:  On Adderall 10 mg - 15 mg every morning, sometimes an additional 5 mg in the afternoon. No headache, lightheadedness, chest pain, palpitations, appetite suppression, or insomnia.    - Continue current medications  - Return in 6 months for Annual physical    Orders:  -     Basic metabolic panel; Future  -     Magnesium; Future  -     Testosterone, free, total, LC/MS/MS; Future  -     Vitamin B12; Future  -     Vitamin D 25 hydroxy; Future  -     Iron Studies (Iron + TIBC); Future    Anhedonia  Comments:  Low energy, mood. Wants testosterone checked. On Wellbutrin as well.   - Check labs  - Continue Wellbutrin at current dose  Orders:  -     Basic metabolic panel; Future  -     Magnesium; Future  -     Testosterone, free, total, LC/MS/MS; Future  -     Vitamin B12; Future  -     Vitamin D 25 hydroxy; Future  -     Iron Studies (Iron + TIBC); Future    Screening for STD (sexually transmitted disease)  -     Syphilis Screening Daune Perch); Future  -     Chlamydia / Gonorrhoeae DNA Urine; Future  -     Chlamydia/N.Gonorrhoeae DNA, NAAT, Pharyngeal; Future         Return in about 6 months (around 04/07/2023) for Annual physical.    No future appointments.    No LOS data to display     LOS MDM    Review of Systems:     ROS - As above in HPI    Medications:     Current Outpatient Medications   Medication Sig    ASHWAGANDHA EXTRACT ORAL Take by mouth.    buPROPion XL Take 1 tablet (150 mg total) by mouth daily.    cholecalciferol (vitamin D3) Take 1 tablet (1,000 Units total) by mouth daily.     dextroamphetamine-amphetamine Take 1 tablet (20 mg total) by mouth 2 times a day for 30 days.    hydrocortisone Place rectally 2 times a day for 30 days.    magnesium Take 200 mg by mouth daily. At night    multivitamin Take 1 tablet by mouth daily.    valACYclovir Take 2 tablets (2,000 mg total) by mouth 2 times a day.    zinc gluconate Take 10 mg by mouth at bedtime.     No current facility-administered medications for this visit.       Past Medical History, Family History, and Social History     I have reviewed the patients medical history with the patient and updated any pertinent past medical, family, and social history in the EMR.      Objective:     Vitals:    10/06/22 1518   BP: 136/70   BP Location: Left upper arm  Patient Position: Sitting   Pulse: 60   Temp: 96.7 F (35.9 C)   SpO2: 99%   Weight: 180 lb 3.2 oz (81.7 kg)   Height: 6' (1.829 m)     Wt Readings from Last 8 Encounters:   10/06/22 180 lb 3.2 oz (81.7 kg)   09/08/22 186 lb (84.4 kg)   03/06/22 182 lb (82.6 kg)   05/29/21 184 lb (83.5 kg)   12/17/20 173 lb (78.5 kg)   04/26/20 177 lb (80.3 kg)       Physical Exam  General:  Cooperative, pleasant.  In no distress.  HENT:  Normocephalic, atraumatic. External ears normal.   Eyes:  EOMI. No scleral icterus.   Heart:  Regular rate and rhythm, S1, S2 normal, no murmurs/rubs/gallops. No peripheral edema.   Extremities:  No cyanosis or clubbing.   Neuro:  Moves all extremities spontaneously and equally. Answers questions appropriately.     BMI is normal.    Pre-visit planning was performed.  I discussed self management and treatment goals with patient. Barriers to meeting these goals were discussed.  Information regarding the usage and side effects of any new medication was provided.    Signed:  Darrol Angel, MD  10/06/2022, 4:03 PM

## 2022-10-10 ENCOUNTER — Other Ambulatory Visit: Admit: 2022-10-10 | Payer: PRIVATE HEALTH INSURANCE

## 2022-10-10 DIAGNOSIS — F900 Attention-deficit hyperactivity disorder, predominantly inattentive type: Secondary | ICD-10-CM

## 2022-10-10 LAB — BASIC METABOLIC PANEL
Anion Gap: 8 mmol/L (ref 3–16)
BUN: 18 mg/dL (ref 7–25)
CO2: 30 mmol/L (ref 21–33)
Calcium: 10.2 mg/dL (ref 8.6–10.3)
Chloride: 103 mmol/L (ref 98–110)
Creatinine: 1.21 mg/dL (ref 0.60–1.30)
EGFR: 82
Glucose: 85 mg/dL (ref 70–100)
Osmolality, Calculated: 293 mOsm/kg (ref 278–305)
Potassium: 4.5 mmol/L (ref 3.5–5.3)
Sodium: 141 mmol/L (ref 133–146)

## 2022-10-10 LAB — VITAMIN D 25 HYDROXY: Vit D, 25-Hydroxy: 50 ng/mL (ref 30.0–100.0)

## 2022-10-10 LAB — MAGNESIUM: Magnesium: 2.1 mg/dL (ref 1.5–2.5)

## 2022-10-10 LAB — TESTOSTERONE, FREE, TOTAL
Testosterone, Free Pct: 2.21 % (ref 1.50–4.20)
Testosterone, Free: 12.11 ng/dL (ref 5.00–21.00)
Testosterone: 548 ng/dL (ref 264–916)

## 2022-10-10 LAB — VITAMIN B12: Vitamin B-12: 707 pg/mL (ref 180–914)

## 2022-10-10 LAB — IRON STUDIES
% Iron Saturation: 37.9 % (ref 15.0–55.0)
Iron: 130 ug/dL (ref 50–212)
TIBC: 343 ug/dL (ref 261–462)

## 2022-10-10 LAB — TREPONEMA PALLIDUM AB WITH REFLEX: Treponema Pallidum: NEGATIVE

## 2022-10-10 LAB — CHLAMYDIA / GONORRHOEAE DNA URINE
Chlamydia Trachomatis DNA Urine: NEGATIVE
Neisseria gonorrhoeae DNA Urine: NEGATIVE

## 2022-12-27 MED ORDER — dextroamphetamine-amphetamine (ADDERALL) 20 mg Tab
20 | ORAL_TABLET | Freq: Two times a day (BID) | ORAL | 0 refills
Start: 2022-12-27 — End: 2022-12-29

## 2022-12-29 MED ORDER — dextroamphetamine-amphetamine (ADDERALL) 20 mg Tab
20 | ORAL_TABLET | Freq: Two times a day (BID) | ORAL | 0 refills | 30.00000 days | Status: AC
Start: 2022-12-29 — End: 2023-01-28

## 2022-12-29 NOTE — Addendum Note (Signed)
Addended by: Dorena Dew A on: 12/29/2022 09:47 AM     Modules accepted: Orders

## 2022-12-31 ENCOUNTER — Ambulatory Visit: Payer: PRIVATE HEALTH INSURANCE

## 2022-12-31 NOTE — Progress Notes (Unsigned)
Procedure: Facial Botox injection  Date of Procedure: 12/31/22      Informed Consent / Counseling Statement:  Plan, alternatives and risks of anesthesia, including death have been explained to and discussed with the patient / legal guardian.  By my assessment, the patient / legal guardian understands and agrees.  Scenario presented in detail.  Questions answered.    Time-out completed to verify correct patient identity, medical conditions, allergies, planned procedure, including site. Medication reconciliation verified.    Anesthesia: None        Site: b/l procerus, corrigators, orbicularis oculi  Dx:  Facial rhytids  Units of Botox:  20 units total    Botox from 50 unit vial  Lot:  [id number]  Exp:  2026/05    Cleansed sites of injection b/l with alcohol prep wipe. The sites for injection were marked with marking pen.    Re-suspended/reconstituted one 50unit vial of botox with 0.53ml of normal saline into a 3ml syringe. Botox solution was then drawn into multiple TB syringes  for a concentration ratio of 1 unit per 0.23ml. Using a 1/5cc syringe.    7 units total administered to corrugator muscles, 3.5 units at two separate locations.  7 units total administered to procerus muscles  6 units total administered to orbicularis oculi muscles on each side    Evaluation of upper and lower lips were performed. Both appeared thin with lack of bulk. Decision was made to inject lips with Juvaderm Ultra Plus. A 25Ga needle was used to create the plane used for injection. A total of 1.2cc of juvaderm Volbella was injected into upper and lower lips starting with white roll and then plumping of the wet dry line.     Refer to media tab of Botox and Filler face sheet for details with scanned mapping.      Complications/Abnormal findings: __None________.  Estimated Blood Loss:  minimal  Patient tolerated procedure well.  Postoperative instructions given both verbally and written.      Rx:  none  Disposition: Patient to follow up on an  as needed basis

## 2023-01-05 MED ORDER — buPROPion XL (WELLBUTRIN XL) 150 MG 24 hr tablet
150 | ORAL_TABLET | Freq: Every day | ORAL | 3 refills
Start: 2023-01-05 — End: 2023-02-16

## 2023-02-12 ENCOUNTER — Ambulatory Visit: Payer: PRIVATE HEALTH INSURANCE

## 2023-02-12 NOTE — Progress Notes (Deleted)
[facility]  Department of Internal Medicine  Acute Visit    Chief Complaint:   No chief complaint on file.    History of Present Illness and Assessment & Plan:     Diagnoses and all orders for this visit:    Travel advice encounter (Primary)  -     Hepatitis A hepatitis B combined vaccine IM (TWINRIX)       No LOS data to display     LOS MDM    Review of Systems:     ROS - As above in HPI    Medications:        Current Outpatient Medications:     ASHWAGANDHA EXTRACT ORAL, Take by mouth., Disp: , Rfl:     buPROPion XL (WELLBUTRIN XL) 150 MG 24 hr tablet, TAKE 1 TABLET BY MOUTH DAILY, Disp: 90 tablet, Rfl: 3    cholecalciferol, vitamin D3, 1000 units tablet, Take 1 tablet (1,000 Units total) by mouth daily., Disp: , Rfl:     dextroamphetamine-amphetamine (ADDERALL) 20 mg Tab, Take 1 tablet (20 mg total) by mouth 2 times a day for 30 days., Disp: 60 tablet, Rfl: 0    magnesium 30 mg tablet, Take 200 mg by mouth daily. At night, Disp: , Rfl:     multivitamin (THERAGRAN) tablet, Take 1 tablet by mouth daily., Disp: , Rfl:     valACYclovir (VALTREX) 1000 MG tablet, Take 2 tablets (2,000 mg total) by mouth 2 times a day., Disp: 4 tablet, Rfl: 5    zinc gluconate 50 mg tablet, Take 10 mg by mouth at bedtime., Disp: , Rfl:     Objective:   There were no vitals filed for this visit.  Wt Readings from Last 8 Encounters:   10/06/22 180 lb 3.2 oz (81.7 kg)   09/08/22 186 lb (84.4 kg)   03/06/22 182 lb (82.6 kg)   05/29/21 184 lb (83.5 kg)   12/17/20 173 lb (78.5 kg)   04/26/20 177 lb (80.3 kg)       Physical Exam  General appearance:  Cooperative, pleasant.  In no distress.  HENT:  Normocephalic, atraumatic. External ears normal. No oropharyngeal erythema, ulcers, exudate.   Eyes:  EOMI. No scleral icterus.   Neck:  Trachea midline. No lymphadenopathy.   Lungs:  No wheezes/rales/rhonchi. No respiratory distress. Symmetric expansion.  Cardiovascular:  Regular rate and rhythm, S1, S2 normal, no  murmurs/rubs/gallops. No peripheral edema.   Abdomen:  Soft, non-tender, non-distended. Bowel sounds normal. No rebound or guarding  Extremities:  No cyanosis or clubbing.   Neuro:  Moves all extremities spontaneously and equally. Answers questions appropriately.     {BMI Quality Measure (Optional):27328}    Pre-visit planning was performed.  I discussed self management and treatment goals with the patient. Barriers to meeting these goals were discussed.  Information regarding the usage and side effects of any new medication was provided.    Signed:  Darrol Angel, MD  02/12/2023, 3:44 PM

## 2023-02-16 ENCOUNTER — Ambulatory Visit: Admit: 2023-02-16 | Payer: PRIVATE HEALTH INSURANCE

## 2023-02-16 DIAGNOSIS — Z7184 Encounter for health counseling related to travel: Secondary | ICD-10-CM

## 2023-02-16 MED ORDER — cholera vaccine, live (VAXCHORA) 4x10exp8 to 2x 10exp9 CF unit SusR
PACK | Freq: Once | ORAL | 0 refills | 1.00000 days | Status: AC
Start: 2023-02-16 — End: 2023-02-16

## 2023-02-16 MED ORDER — typhoid (VIVOTIF BERNA) DR capsule
2 | ORAL_CAPSULE | ORAL | 0 refills | 8.00000 days | Status: AC
Start: 2023-02-16 — End: 2023-02-24

## 2023-02-16 MED ORDER — buPROPion XL (WELLBUTRIN XL) 150 MG 24 hr tablet
150 | ORAL_TABLET | Freq: Every day | ORAL | 3 refills | Status: AC
Start: 2023-02-16 — End: ?

## 2023-02-16 MED ORDER — atovaquone-proguaniL (MALARONE) 250-100 mg Tab
250-100 | ORAL_TABLET | Freq: Every day | ORAL | 0 refills | Status: AC
Start: 2023-02-16 — End: ?

## 2023-02-16 MED ORDER — dextroamphetamine-amphetamine (ADDERALL) 20 mg Tab
20 | ORAL_TABLET | Freq: Two times a day (BID) | ORAL | 0 refills | Status: AC
Start: 2023-02-16 — End: 2023-03-18

## 2023-02-16 NOTE — Assessment & Plan Note (Signed)
Will be gone to [place], [place], [place] and [place] from 4/30 through 6/1. [place] for 3 weeks. The others for 2 weeks.   - Hep A and Hep B boosters today  - Cholera vaccine  - Typhoid vaccine  - Malaria ppx

## 2023-02-16 NOTE — Progress Notes (Signed)
[facility]  Department of Internal Medicine  Acute Visit    Chief Complaint:     Chief Complaint   Patient presents with    Immunizations     History of Present Illness and Assessment & Plan:     Alan Reynolds was seen today for immunizations.    Diagnoses and all orders for this visit:    Travel advice encounter (Primary)  Assessment & Plan:  Will be gone to [place], [place], [place] and [place] from 4/30 through 6/1. [place] for 3 weeks. The others for 2 weeks.   - Hep A and Hep B boosters today  - Cholera vaccine  - Typhoid vaccine  - Malaria ppx    Orders:  -     typhoid (VIVOTIF BERNA) DR capsule; Take 1 capsule by mouth every other day for 8 days. Complete at least 7 days prior to potential exposure.  -     cholera vaccine, live [facility]) 4x10exp8 to 2x 10exp9 CF unit SusR; Take 100 mLs by mouth once for 1 dose. Take at least 10 days before travel to a cholera-affected area. Eating and drinking should be avoided for one hour before and after vaccine administration. No antibiotics within two weeks.  -     atovaquone-proguaniL (MALARONE) 250-100 mg Tab; Take 1 tablet by mouth daily. Start 48 hours prior to arrival in endemic area. Continue through 7 days after leaving endemic area.    Need for malaria prophylaxis  -     atovaquone-proguaniL (MALARONE) 250-100 mg Tab; Take 1 tablet by mouth daily. Start 48 hours prior to arrival in endemic area. Continue through 7 days after leaving endemic area.    Attention deficit hyperactivity disorder (ADHD), unspecified ADHD type  -     buPROPion XL (WELLBUTRIN XL) 150 MG 24 hr tablet; Take 1 tablet (150 mg total) by mouth daily.    Attention deficit hyperactivity disorder (ADHD), predominantly inattentive type  -     dextroamphetamine-amphetamine (ADDERALL) 20 mg Tab; Take 1 tablet (20 mg total) by mouth 2 times a day for 30 days.    Other orders  -     Cancel: Hepatitis A hepatitis B combined vaccine IM (TWINRIX)  -     Hepatitis A vaccine adult IM (HAVRIX)  -      Hepatitis B vaccine adult IM       I spent a total of 20 minutes on the day of the visit.     LOS MDM    Review of Systems:     ROS - As above in HPI    Medications:        Current Outpatient Medications:     buPROPion XL (WELLBUTRIN XL) 150 MG 24 hr tablet, Take 1 tablet (150 mg total) by mouth daily., Disp: 90 tablet, Rfl: 3    cholecalciferol, vitamin D3, 1000 units tablet, Take 1 tablet (1,000 Units total) by mouth daily., Disp: , Rfl:     dextroamphetamine-amphetamine (ADDERALL) 20 mg Tab, Take 1 tablet (20 mg total) by mouth 2 times a day for 30 days., Disp: 60 tablet, Rfl: 0    magnesium 30 mg tablet, Take 200 mg by mouth daily. At night, Disp: , Rfl:     multivitamin (THERAGRAN) tablet, Take 1 tablet by mouth daily., Disp: , Rfl:     valACYclovir (VALTREX) 1000 MG tablet, Take 2 tablets (2,000 mg total) by mouth 2 times a day., Disp: 4 tablet, Rfl: 5    zinc gluconate 50  mg tablet, Take 10 mg by mouth at bedtime., Disp: , Rfl:     atovaquone-proguaniL (MALARONE) 250-100 mg Tab, Take 1 tablet by mouth daily. Start 48 hours prior to arrival in endemic area. Continue through 7 days after leaving endemic area., Disp: 43 tablet, Rfl: 0    cholera vaccine, live (VAXCHORA) 4x10exp8 to 2x 10exp9 CF unit SusR, Take 100 mLs by mouth once for 1 dose. Take at least 10 days before travel to a cholera-affected area. Eating and drinking should be avoided for one hour before and after vaccine administration. No antibiotics within two weeks., Disp: 1 packet, Rfl: 0    typhoid (VIVOTIF BERNA) DR capsule, Take 1 capsule by mouth every other day for 8 days. Complete at least 7 days prior to potential exposure., Disp: 4 capsule, Rfl: 0    Objective:     Vitals:    02/16/23 1609   BP: 124/60   BP Location: Left upper arm   Patient Position: Sitting   Pulse: 60   SpO2: 96%   Weight: 177 lb 12.8 oz (80.6 kg)   Height: 6' (1.829 m)     Wt Readings from Last 8 Encounters:   02/16/23 177 lb 12.8 oz (80.6 kg)   10/06/22 180 lb 3.2 oz  (81.7 kg)   09/08/22 186 lb (84.4 kg)   03/06/22 182 lb (82.6 kg)   05/29/21 184 lb (83.5 kg)   12/17/20 173 lb (78.5 kg)   04/26/20 177 lb (80.3 kg)       Physical Exam  General appearance:  Cooperative, pleasant.  In no distress.  HENT:  Normocephalic, atraumatic. External ears normal.   Eyes:  EOMI. No scleral icterus.   Extremities:  No cyanosis or clubbing.   Neuro:  Moves all extremities spontaneously and equally. Answers questions appropriately.     BMI is normal.    Pre-visit planning was performed.  I discussed self management and treatment goals with the patient. Barriers to meeting these goals were discussed.  Information regarding the usage and side effects of any new medication was provided.    Signed:  Darrol Angel, MD  02/16/2023, 4:58 PM

## 2023-02-17 NOTE — Telephone Encounter (Signed)
Patient informed via MyChart message that a prior authorization has been approved for Amphetamine-Dextroamphetamine until 02/16/24. Click on RX Prior Authorization link below for prior auth details.

## 2023-07-02 ENCOUNTER — Ambulatory Visit: Admit: 2023-07-03 | Payer: PRIVATE HEALTH INSURANCE

## 2023-07-02 ENCOUNTER — Ambulatory Visit: Admit: 2023-07-02 | Payer: PRIVATE HEALTH INSURANCE

## 2023-07-02 DIAGNOSIS — Z113 Encounter for screening for infections with a predominantly sexual mode of transmission: Secondary | ICD-10-CM

## 2023-07-02 DIAGNOSIS — Z0000 Encounter for general adult medical examination without abnormal findings: Secondary | ICD-10-CM

## 2023-07-02 LAB — CHLAMYDIA/N.GONORRHOEAE DNA, NAAT, PHARYNGEAL
Chlamydia Trachomatis DNA: NEGATIVE
Neisseria Gonorrhoeae DNA: NEGATIVE

## 2023-07-02 MED ORDER — dextroamphetamine-amphetamine (ADDERALL) 20 mg Tab
20 | ORAL_TABLET | Freq: Two times a day (BID) | ORAL | 0 refills | Status: AC
Start: 2023-07-02 — End: 2023-08-01

## 2023-07-02 NOTE — Progress Notes (Signed)
[facility]  Department of Internal Medicine  Annual Physical    Chief Complaint:     Chief Complaint   Patient presents with    Annual Exam     History of Present Illness:  Assessment and Plan:     Alan Reynolds was seen today for annual exam.    Diagnoses and all orders for this visit:    Routine general medical examination at a health care facility (Primary)  Assessment & Plan:  - Blood work: See below  - Vaccines: UTD. Had tetanus booster in dental school  - Exercise: At goal    Orders:  -     Lipid Profile; Future  -     Hemoglobin A1c; Future  -     Comprehensive metabolic panel; Future  -     CBC; Future  -     HPV vaccine 9 valent IM    Attention deficit hyperactivity disorder (ADHD), predominantly inattentive type  -     dextroamphetamine-amphetamine (ADDERALL) 20 mg Tab; Take 1 tablet (20 mg total) by mouth 2 times a day for 30 days.    Routine screening for STI (sexually transmitted infection)  -     Chlamydia / Gonorrhoeae DNA Urine; Future  -     HIV 1+2 Antibody/Antigen with Reflex; Future  -     Syphilis Screening Daune Perch); Future  -     Chlamydia/N.Gonorrhoeae DNA, NAAT, Pharyngeal; Future    Fatigue, unspecified type  -     Testosterone, free, total, LC/MS/MS; Future    Need for HPV vaccination  -     HPV vaccine 9 valent IM         Return in about 6 months (around 01/01/2024) for Follow Up .    No future appointments.    No LOS data to display       LOS MDM    Past Medical History:     Past Medical History:   Diagnosis Date    ADHD     Anxiety      Patient Active Problem List   Diagnosis    ADHD    Routine general medical examination at a health care facility    Travel advice encounter       Medications:     Current Outpatient Medications   Medication Sig    buPROPion XL Take 1 tablet (150 mg total) by mouth daily.    cholecalciferol (vitamin D3) Take 1 tablet (1,000 Units total) by mouth daily.    dextroamphetamine-amphetamine Take 1 tablet (20 mg total) by mouth 2 times a day for 30 days.     magnesium Take 200 mg by mouth daily. At night    multivitamin Take 1 tablet by mouth daily.    valACYclovir Take 2 tablets (2,000 mg total) by mouth 2 times a day.    zinc gluconate Take 10 mg by mouth at bedtime.     No current facility-administered medications for this visit.       Allergies:     No Known Drug Allergies or Adverse Reactions    Past Surgical History:     Past Surgical History:   Procedure Laterality Date    EYE SURGERY      Trochlear nerve palsy    Right foot surgery      x2 on right navicular       Social History:     Social History     Socioeconomic History    Marital status:  Single     Spouse name: Not on file    Number of children: Not on file    Years of education: Not on file    Highest education level: Not on file   Occupational History    Not on file   Tobacco Use    Smoking status: Never    Smokeless tobacco: Never   Substance and Sexual Activity    Alcohol use: Yes     Comment: 4-5 per week    Drug use: Never    Sexual activity: Yes     Partners: Female     Birth control/protection: Condom     Comment: 10 partners in last year   Other Topics Concern    Caffeine Use Yes     Comment: 2-3 coffees per day    Occupational Exposure No    Exercise Yes     Comment: Weight lifting, running    Seat Belt Yes   Social History Narrative    Exercise:  Lifts weights and runs 6 days per week    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    Hobbies:  Facilities manager, Work out, Occupational psychologist Determinants of Psychologist, prison and probation services Strain: Not on file   Food Insecurity: No Food Insecurity (02/10/2023)    Yearly Questionnaire     Do you need any assistance with obtaining housing, meals, medication, transportation or medical equipment?: No     Assistance needed for:: Not on file   Transportation Needs: No Transportation Needs (02/10/2023)    Yearly Questionnaire     Do you need any assistance with obtaining housing, meals, medication, transportation or medical equipment?: No     Assistance needed for:: Not on file    Physical Activity: Not on file   Stress: Not on file   Social Connections: Not on file   Intimate Partner Violence: Not on file   Housing Stability: Low Risk  (02/10/2023)    Yearly Questionnaire     Do you need any assistance with obtaining housing, meals, medication, transportation or medical equipment?: No     Assistance needed for:: Not on file       Alcohol misuse screening: performed, negative    Family History:     Family History   Problem Relation Age of Onset    ADD / ADHD Mother     Hyperlipidemia Father     Gout Father     Nephrolithiasis Father     ADD / ADHD Sister     Cancer Maternal Grandmother         Lung Cancer, Smoker    Lung Cancer Maternal Grandmother         Smoker    Cerebral aneurysm Paternal Grandmother     Cerebral aneurysm Paternal great-grandfather     Colon Cancer Neg Hx     Inflammatory bowel disease Neg Hx        Review of Systems:     Review of Systems   Constitutional:  Positive for malaise/fatigue. Negative for diaphoresis and weight loss.   HENT:  Negative for congestion, hearing loss and sore throat.    Eyes:  Negative for blurred vision and double vision.   Respiratory:  Negative for cough and shortness of breath.    Cardiovascular:  Negative for chest pain and palpitations.   Gastrointestinal:  Negative for abdominal pain, constipation, diarrhea and nausea.   Genitourinary:  Negative for dysuria and frequency.   Musculoskeletal:  Negative  for joint pain and myalgias.   Skin:  Negative for itching and rash.        No changing moles.    Neurological:  Negative for dizziness and headaches.   Endo/Heme/Allergies:  Negative for environmental allergies.   Psychiatric/Behavioral:  Negative for depression. The patient is not nervous/anxious and does not have insomnia.        Objective:     Vitals:    07/02/23 1457   BP: 106/78   BP Location: Right upper arm   Patient Position: Sitting   BP Cuff Size: Large   Pulse: 65   Temp: 97.9 F (36.6 C)   TempSrc: Temporal   SpO2: 98%   Weight:  177 lb 6.4 oz (80.5 kg)   Height: 6' (1.829 m)     Wt Readings from Last 8 Encounters:   07/02/23 177 lb 6.4 oz (80.5 kg)   02/16/23 177 lb 12.8 oz (80.6 kg)   10/06/22 180 lb 3.2 oz (81.7 kg)   09/08/22 186 lb (84.4 kg)   03/06/22 182 lb (82.6 kg)   05/29/21 184 lb (83.5 kg)   12/17/20 173 lb (78.5 kg)   04/26/20 177 lb (80.3 kg)       Physical Exam  General appearance:  Cooperative, pleasant.  In no distress.  HENT:  Normocephalic, atraumatic. External ears normal.   Eyes:  EOMI. No scleral icterus.   Neck:  Trachea midline. No lymphadenopathy. No thyromegaly.   Lungs:  No wheezes/rales/rhonchi. No respiratory distress.  Heart:  Regular rate and rhythm, S1, S2 normal, no murmurs/rubs/gallops. No peripheral edema.   Abdomen:  Soft, non-tender, non-distended. Bowel sounds normal. No rebound or guarding.   Skin:  No rashes or lesions.   Extremities:  Warm and well-perfused. No cyanosis or clubbing.   Neuro:  Moves all extremities spontaneously and equally. Normal gait.   Psych:  Normal thought content and behavior.      BMI is normal.    Pre-visit planning was performed.  I discussed self management and treatment goals with the patient. Barriers to meeting these goals were discussed.  Information regarding the usage and side effects of any new medication was provided.    Signed:  Darrol Angel, MD   07/02/2023, 5:38 PM

## 2023-07-02 NOTE — Assessment & Plan Note (Addendum)
-   Blood work: See below  - Vaccines: UTD. Had tetanus booster in dental school  - Exercise: At goal

## 2023-07-02 NOTE — Patient Instructions (Addendum)
-   Have your blood work drawn down the hall in [address]. Our office will contact you with the results.  - Return in 1 month for nurse visit for HPV vaccine  - For optimal health, it is recommended that you   - Engage in moderate intensity aerobic activity for 30 minutes on 5 days a week. We also recommend muscle strengthening exercises twice a week.   - Eat a healthy diet. Generally, I recommend eating whole foods (foods closest to how they naturally occur - fruits, vegetables, nuts, chicken, fish) rather than processed foods (breads, pastas, chips, crackers, cakes, etc). The diet with the best evidence for being good for your health is the Mediterranean Diet.   - Follow Up in 6 months

## 2023-07-03 ENCOUNTER — Ambulatory Visit: Admit: 2023-07-03 | Payer: PRIVATE HEALTH INSURANCE

## 2023-07-03 DIAGNOSIS — Z0000 Encounter for general adult medical examination without abnormal findings: Secondary | ICD-10-CM

## 2023-07-03 LAB — LIPID PANEL
Cholesterol, Total: 165 mg/dL (ref 0–200)
HDL: 72 mg/dL (ref 60–92)
LDL Cholesterol: 75 mg/dL
Non-HDL Cholesterol, Calculated: 93 mg/dL (ref 0–129)
Triglycerides: 90 mg/dL (ref 10–149)

## 2023-07-03 LAB — COMPREHENSIVE METABOLIC PANEL
ALT: 31 U/L (ref 7–52)
AST: 34 U/L (ref 13–39)
Albumin: 5.1 g/dL (ref 3.5–5.7)
Alkaline Phosphatase: 41 U/L (ref 36–125)
Anion Gap: 11 mmol/L (ref 3–16)
BUN: 24 mg/dL (ref 7–25)
CO2: 25 mmol/L (ref 21–33)
Calcium: 10.4 mg/dL — ABNORMAL HIGH (ref 8.6–10.3)
Chloride: 104 mmol/L (ref 98–110)
Creatinine: 1.15 mg/dL (ref 0.60–1.30)
EGFR: 87
Glucose: 79 mg/dL (ref 70–100)
Osmolality, Calculated: 293 mOsm/kg (ref 278–305)
Potassium: 4.3 mmol/L (ref 3.5–5.3)
Sodium: 140 mmol/L (ref 133–146)
Total Bilirubin: 0.7 mg/dL (ref 0.0–1.5)
Total Protein: 7.4 g/dL (ref 6.4–8.9)

## 2023-07-03 LAB — CBC
Hematocrit: 45.8 % (ref 38.5–50.0)
Hemoglobin: 15.7 g/dL (ref 13.2–17.1)
MCH: 31.3 pg (ref 27.0–33.0)
MCHC: 34.4 g/dL (ref 32.0–36.0)
MCV: 91 fL (ref 80.0–100.0)
MPV: 9.3 fL (ref 7.5–11.5)
Platelets: 224 10*3/uL (ref 140–400)
RBC: 5.03 10*6/uL (ref 4.20–5.80)
RDW: 12.7 % (ref 11.0–15.0)
WBC: 7.5 10*3/uL (ref 3.8–10.8)

## 2023-07-03 LAB — CHLAMYDIA / GONORRHOEAE DNA URINE
Chlamydia Trachomatis DNA Urine: NEGATIVE
Neisseria gonorrhoeae DNA Urine: NEGATIVE

## 2023-07-03 LAB — HIV 1+2 ANTIBODY/ANTIGEN WITH REFLEX: HIV 1+2 AB/AGN: NONREACTIVE

## 2023-07-03 LAB — TREPONEMA PALLIDUM AB WITH REFLEX: Treponema Pallidum: NEGATIVE

## 2023-07-03 LAB — HEMOGLOBIN A1C: Hemoglobin A1C: 5 % (ref 4.0–5.6)

## 2023-07-29 ENCOUNTER — Ambulatory Visit: Admit: 2023-07-29 | Payer: PRIVATE HEALTH INSURANCE

## 2023-07-29 DIAGNOSIS — R5383 Other fatigue: Secondary | ICD-10-CM

## 2023-07-29 LAB — TESTOSTERONE, FREE, TOTAL
Testosterone, Free Pct: 2.03 % (ref 1.50–4.20)
Testosterone, Free: 10.52 ng/dL (ref 5.00–21.00)
Testosterone: 518 ng/dL (ref 264–916)

## 2023-08-03 MED ORDER — ketoconazole (NIZORAL) 2 % shampoo
2 | Freq: Two times a day (BID) | TOPICAL | 1 refills | 30.00000 days
Start: 2023-08-03 — End: 2023-08-04

## 2023-08-04 MED ORDER — ketoconazole (NIZORAL) 2 % shampoo
2 | TOPICAL | 1 refills | 30.00000 days | Status: AC
Start: 2023-08-04 — End: ?

## 2023-08-21 MED ORDER — valACYclovir (VALTREX) 1000 MG tablet
1000 | ORAL_TABLET | Freq: Two times a day (BID) | ORAL | 5 refills | Status: AC
Start: 2023-08-21 — End: ?

## 2024-01-19 ENCOUNTER — Ambulatory Visit: Admit: 2024-01-19 | Discharge: 2024-01-21 | Payer: PRIVATE HEALTH INSURANCE

## 2024-01-19 DIAGNOSIS — L988 Other specified disorders of the skin and subcutaneous tissue: Secondary | ICD-10-CM

## 2024-01-19 NOTE — Progress Notes (Signed)
 [facility]   [facility]ral [facility]illof[facility]      Pt. Name: Alan Reynolds  Pt. MRN: [id number] DOB: [birth year]  Attending: Dr. Aram Beecham, DDS, MD    Resident: Dewain Penning, DDS  Location of Care: [facility] MED[facility]        Procedure: Botox injections  Date of Procedure: 01/19/2024    Reviewed H&P; no significant changes to medical history or medications.    Informed Consent/Counseling Statement:  Plan, alternatives and risks of procedure have been explained to and discussed with the patient . By my assessment, they understand and agree. Scenario presented in detail. Question answered. Verbal consent affirmed       No local anesthesia administered.    Site: frontalis, corrugator, procerus, orbicularis oculi muscles  Dx:  Facial Rhytidosis   Units of Botox:  47 units total    Botox Details  Lot:  [id number]  Exp:  2026/11    Cleansed sites of injections with alcohol prep wipe. Indicated sites for injection were marked with site marking pen.    Reconstituted one 50unit vial of botox with 0.39ml of normal saline into a 3ml syringe. Botox solution was then drawn into multiple TB syringes  for a concentration ratio of 1 unit per 0.75ml. Using a 1/5cc TB syringe.    8 units total administered to each corrugator muscle  6 units total administered to procerus muscle  5 units total administered to orbicularis oculi muscles on each side at 2 locations per side  20 units total administered to frontalis muscle at 10 locations      Refer to media tab of Botox face sheet for details and mapping.      Complications/Abnormal findings: None  Estimated Blood Loss:  minimal  Patient tolerated procedure well.  Postoperative instructions given both verbally and written.      Rx:  none  Disposition: Patient to follow up in ~3 months for re-evaluation and if needed, repeat botox. Follow sooner PRN    Signed by: Dewain Penning  Date: 01/19/2024 Time: 4:51 PM

## 2024-02-23 MED ORDER — buPROPion XL (WELLBUTRIN XL) 150 MG 24 hr tablet
150 | ORAL_TABLET | Freq: Every day | ORAL | 3 refills | 30.00000 days
Start: 2024-02-23 — End: 2024-03-01

## 2024-03-01 MED ORDER — buPROPion XL (WELLBUTRIN XL) 150 MG 24 hr tablet
150 | ORAL_TABLET | Freq: Every day | ORAL | 3 refills | 30.00000 days
Start: 2024-03-01 — End: 2024-03-04

## 2024-03-04 MED ORDER — buPROPion XL (WELLBUTRIN XL) 150 MG 24 hr tablet
150 | ORAL_TABLET | Freq: Every day | ORAL | 3 refills | 30.00000 days | Status: AC
Start: 2024-03-04 — End: ?

## 2024-04-05 ENCOUNTER — Ambulatory Visit: Admit: 2024-04-06 | Payer: PRIVATE HEALTH INSURANCE

## 2024-04-05 ENCOUNTER — Ambulatory Visit: Admit: 2024-04-05 | Payer: PRIVATE HEALTH INSURANCE

## 2024-04-05 DIAGNOSIS — Z113 Encounter for screening for infections with a predominantly sexual mode of transmission: Secondary | ICD-10-CM

## 2024-04-05 DIAGNOSIS — Z0000 Encounter for general adult medical examination without abnormal findings: Secondary | ICD-10-CM

## 2024-04-05 LAB — CHLAMYDIA/N.GONORRHOEAE DNA, NAAT, PHARYNGEAL
Chlamydia Trachomatis DNA: NEGATIVE
Neisseria Gonorrhoeae DNA: NEGATIVE

## 2024-04-05 NOTE — Patient Instructions (Addendum)
-   Have your blood work drawn down the hall in [address]. Our office will contact you with the results.  - I can refill the Adderall for up to 6 months. I can refill the Wellbutrin  (bupropion ) for up to a year  - Have your 3rd (and final) Gardasil/HPV vaccine in 5 months or more (November or later)

## 2024-04-05 NOTE — Progress Notes (Signed)
 [facility]  Department of Internal Medicine  Follow Up Visit    Chief Complaint:     Chief Complaint   Patient presents with    Follow-up      History of Present Illness:  Assessment and Plan:      Alan Reynolds was seen today for follow-up.    Diagnoses and all orders for this visit:    Routine health maintenance (Primary)  -     Lipid Profile; Future  -     Hemoglobin A1c; Future  -     Comprehensive metabolic panel; Future  -     CBC; Future    Fatigue, unspecified type  -     Testosterone, Total; Future    Screening for STD (sexually transmitted disease)  -     HIV 1+2 Antibody/Antigen with Reflex; Future  -     Syphilis Screening Alan Reynolds); Future  -     Chlamydia / Gonorrhoeae DNA Urine; Future  -     Chlamydia/N.Gonorrhoeae DNA, NAAT, Pharyngeal; Future    Attention deficit hyperactivity disorder (ADHD), unspecified ADHD type  Assessment & Plan:  Was on bupropion  for many years but recently stopped. 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   - Continue off Wellbutrin   - Continue off Adderall      Other orders  -     HPV vaccine 9 valent IM         No follow-ups on file.    No future appointments.    No LOS data to display       LOS MDM    Review of Systems:     ROS - As above in HPI    Medications:     Current Outpatient Medications   Medication Sig    cholecalciferol (vitamin D3) Take 1 tablet (1,000 Units total) by mouth daily.    ketoconazole  Apply to affected areas of damp skin, lather, leave on 5 minutes, and rinse. Apply once daily for 3 consecutive days.    magnesium Take 200 mg by mouth daily. At night    multivitamin Take 1 tablet by mouth daily.    valACYclovir  Take 2 tablets (2,000 mg total) by mouth 2 times a day.    zinc gluconate Take 10 mg by mouth at bedtime.    buPROPion  XL Take 1 tablet (150 mg total) by mouth daily. (Patient not taking: Reported on 04/05/2024)    dextroamphetamine -amphetamine  Take 1 tablet (20 mg total) by mouth 2 times a day for 30  days. (Patient not taking: Reported on 04/05/2024)     No current facility-administered medications for this visit.       Past Medical History, Family History, and Social History     I have reviewed the patients medical history with the patient and updated any pertinent past medical, family, and social history in the EMR.      Objective:     Vitals:    04/05/24 1531   BP: 118/82   BP Location: Left upper arm   Patient Position: Sitting   Pulse: 55   Temp: 96.6 F (35.9 C)   SpO2: 98%   Weight: 182 lb (82.6 kg)   Height: 6' (1.829 m)     Wt Readings from Last 8 Encounters:   04/05/24 182 lb (82.6 kg)   07/02/23 177 lb 6.4 oz (80.5 kg)   02/16/23 177 lb 12.8 oz (80.6 kg)   10/06/22 180 lb 3.2 oz (81.7  kg)   09/08/22 186 lb (84.4 kg)   03/06/22 182 lb (82.6 kg)   05/29/21 184 lb (83.5 kg)   12/17/20 173 lb (78.5 kg)       Physical Exam  General:  Cooperative, pleasant.  In no distress.  HENT:  Normocephalic, atraumatic. External ears normal.   Eyes:  EOMI. No scleral icterus.   Extremities:  No cyanosis or clubbing.   Neuro:  Moves all extremities spontaneously and equally. Answers questions appropriately.     BMI is normal.    Pre-visit planning was performed.  I discussed self management and treatment goals with patient. Barriers to meeting these goals were discussed.  Information regarding the usage and side effects of any new medication was provided.    Signed:  Raymonde Calico, MD  04/07/2024, 1:06 PM

## 2024-04-06 ENCOUNTER — Ambulatory Visit: Admit: 2024-04-06 | Payer: PRIVATE HEALTH INSURANCE

## 2024-04-06 DIAGNOSIS — Z0000 Encounter for general adult medical examination without abnormal findings: Secondary | ICD-10-CM

## 2024-04-06 LAB — CBC
Hematocrit: 45.1 % (ref 38.5–50.0)
Hemoglobin: 15 g/dL (ref 13.2–17.1)
MCH: 30.1 pg (ref 27.0–33.0)
MCHC: 33.2 g/dL (ref 32.0–36.0)
MCV: 90.7 fL (ref 80.0–100.0)
MPV: 8.9 fL (ref 7.5–11.5)
Platelets: 198 10*3/uL (ref 140–400)
RBC: 4.97 10*6/uL (ref 4.20–5.80)
RDW: 12.8 % (ref 11.0–15.0)
WBC: 6.7 10*3/uL (ref 3.8–10.8)

## 2024-04-06 LAB — COMPREHENSIVE METABOLIC PANEL
ALT: 29 U/L (ref 7–52)
AST: 34 U/L (ref 13–39)
Albumin: 4.8 g/dL (ref 3.5–5.7)
Alkaline Phosphatase: 34 U/L — ABNORMAL LOW (ref 36–125)
Anion Gap: 9 mmol/L (ref 3–16)
BUN: 24 mg/dL (ref 7–25)
CO2: 27 mmol/L (ref 21–33)
Calcium: 9.5 mg/dL (ref 8.6–10.3)
Chloride: 104 mmol/L (ref 98–110)
Creatinine: 1.11 mg/dL (ref 0.60–1.30)
EGFR: 90
Glucose: 73 mg/dL (ref 70–100)
Osmolality, Calculated: 293 mosm/kg (ref 278–305)
Potassium: 4.1 mmol/L (ref 3.5–5.3)
Sodium: 140 mmol/L (ref 133–146)
Total Bilirubin: 0.7 mg/dL (ref 0.0–1.5)
Total Protein: 6.9 g/dL (ref 6.4–8.9)

## 2024-04-06 LAB — TREPONEMA PALLIDUM AB WITH REFLEX: Treponema Pallidum: NEGATIVE

## 2024-04-06 LAB — HIV 1+2 ANTIBODY/ANTIGEN WITH REFLEX: HIV 1+2 AB/AGN: NONREACTIVE

## 2024-04-06 LAB — TESTOSTERONE: Testosterone: 545 ng/dL (ref 175–781)

## 2024-04-06 LAB — HEMOGLOBIN A1C: Hemoglobin A1C: 4.9 % (ref 4.0–5.6)

## 2024-04-06 LAB — LIPID PANEL
Cholesterol, Total: 169 mg/dL (ref 0–200)
HDL: 73 mg/dL (ref 60–92)
LDL Cholesterol: 89 mg/dL
Non-HDL Cholesterol, Calculated: 96 mg/dL (ref 0–129)
Triglycerides: 36 mg/dL (ref 10–149)

## 2024-04-06 LAB — CHLAMYDIA / GONORRHOEAE DNA URINE
Chlamydia Trachomatis DNA Urine: NEGATIVE
Neisseria gonorrhoeae DNA Urine: NEGATIVE

## 2024-04-07 NOTE — Assessment & Plan Note (Signed)
 Was on bupropion  for many years but recently stopped. 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   - Continue off Wellbutrin   - Continue off Adderall

## 2024-10-07 MED ORDER — DEXTROAMPHETAMINE-AMPHETAMINE 20 MG TABLET
20 | ORAL_TABLET | Freq: Two times a day (BID) | ORAL | 0 refills
Start: 2024-10-07 — End: 2024-10-07

## 2024-10-07 MED ORDER — DEXTROAMPHETAMINE-AMPHETAMINE 20 MG TABLET
20 | ORAL_TABLET | Freq: Two times a day (BID) | ORAL | 0 refills | 30.00000 days | Status: AC
Start: 2024-10-07 — End: 2024-11-06
